# Patient Record
Sex: Female | Born: 1955 | Race: White | Hispanic: No | Marital: Married | Smoking: Former smoker
Health system: Southern US, Community
[De-identification: ages and names within clinical notes are randomized; demographics above are authoritative.]

## PROBLEM LIST (undated history)

## (undated) DIAGNOSIS — I10 Essential (primary) hypertension: Secondary | ICD-10-CM

## (undated) DIAGNOSIS — G4733 Obstructive sleep apnea (adult) (pediatric): Secondary | ICD-10-CM

## (undated) DIAGNOSIS — F32A Depression, unspecified: Secondary | ICD-10-CM

## (undated) DIAGNOSIS — Z9989 Dependence on other enabling machines and devices: Secondary | ICD-10-CM

## (undated) DIAGNOSIS — R112 Nausea with vomiting, unspecified: Secondary | ICD-10-CM

## (undated) DIAGNOSIS — R569 Unspecified convulsions: Secondary | ICD-10-CM

## (undated) DIAGNOSIS — Z9889 Other specified postprocedural states: Secondary | ICD-10-CM

## (undated) DIAGNOSIS — M1990 Unspecified osteoarthritis, unspecified site: Secondary | ICD-10-CM

## (undated) DIAGNOSIS — R011 Cardiac murmur, unspecified: Secondary | ICD-10-CM

## (undated) DIAGNOSIS — F419 Anxiety disorder, unspecified: Secondary | ICD-10-CM

## (undated) DIAGNOSIS — I82609 Acute embolism and thrombosis of unspecified veins of unspecified upper extremity: Secondary | ICD-10-CM

## (undated) DIAGNOSIS — G4730 Sleep apnea, unspecified: Secondary | ICD-10-CM

## (undated) DIAGNOSIS — K759 Inflammatory liver disease, unspecified: Secondary | ICD-10-CM

## (undated) DIAGNOSIS — J449 Chronic obstructive pulmonary disease, unspecified: Secondary | ICD-10-CM

## (undated) DIAGNOSIS — J189 Pneumonia, unspecified organism: Secondary | ICD-10-CM

## (undated) DIAGNOSIS — F329 Major depressive disorder, single episode, unspecified: Secondary | ICD-10-CM

## (undated) HISTORY — PX: APPENDECTOMY: SHX54

## (undated) HISTORY — PX: OTHER SURGICAL HISTORY: SHX169

## (undated) HISTORY — PX: TUBAL LIGATION: SHX77

## (undated) HISTORY — PX: ABDOMINAL HYSTERECTOMY: SHX81

## (undated) HISTORY — PX: FRACTURE SURGERY: SHX138

## (undated) HISTORY — PX: BACK SURGERY: SHX140

## (undated) HISTORY — PX: ROTATOR CUFF REPAIR: SHX139

## (undated) HISTORY — PX: CHOLECYSTECTOMY: SHX55

## (undated) HISTORY — PX: BLADDER SUSPENSION: SHX72

## (undated) HISTORY — PX: EYE SURGERY: SHX253

---

## 1999-07-24 ENCOUNTER — Other Ambulatory Visit: Admission: RE | Admit: 1999-07-24 | Discharge: 1999-08-09 | Payer: Self-pay

## 2002-04-05 ENCOUNTER — Inpatient Hospital Stay (HOSPITAL_COMMUNITY): Admission: RE | Admit: 2002-04-05 | Discharge: 2002-04-07 | Payer: Self-pay | Admitting: Orthopedic Surgery

## 2002-04-05 ENCOUNTER — Encounter: Payer: Self-pay | Admitting: Orthopedic Surgery

## 2003-01-22 ENCOUNTER — Encounter: Payer: Self-pay | Admitting: Emergency Medicine

## 2003-01-22 ENCOUNTER — Emergency Department (HOSPITAL_COMMUNITY): Admission: EM | Admit: 2003-01-22 | Discharge: 2003-01-22 | Payer: Self-pay | Admitting: Emergency Medicine

## 2005-03-28 ENCOUNTER — Encounter: Admission: RE | Admit: 2005-03-28 | Discharge: 2005-03-28 | Payer: Self-pay | Admitting: Orthopaedic Surgery

## 2005-06-04 ENCOUNTER — Encounter: Admission: RE | Admit: 2005-06-04 | Discharge: 2005-06-04 | Payer: Self-pay | Admitting: Orthopedic Surgery

## 2005-06-18 ENCOUNTER — Encounter: Admission: RE | Admit: 2005-06-18 | Discharge: 2005-06-18 | Payer: Self-pay | Admitting: Orthopedic Surgery

## 2007-06-03 ENCOUNTER — Ambulatory Visit (HOSPITAL_BASED_OUTPATIENT_CLINIC_OR_DEPARTMENT_OTHER): Admission: RE | Admit: 2007-06-03 | Discharge: 2007-06-03 | Payer: Self-pay | Admitting: Orthopedic Surgery

## 2010-12-15 ENCOUNTER — Encounter: Payer: Self-pay | Admitting: Orthopaedic Surgery

## 2011-04-08 NOTE — Op Note (Signed)
Tiffany Hopkins, Tiffany Hopkins               ACCOUNT NO.:  [id number]   MEDICAL RECORD NO.:  [id number]          PATIENT TYPE:  AMB   LOCATION:  [facility]                         FACILITY:  [facility]   PHYSICIAN:  Deidre Ala, M.D.    DATE OF BIRTH:  11/07/1956   DATE OF PROCEDURE:  06/03/2007  DATE OF DISCHARGE:                               OPERATIVE REPORT   PREOPERATIVE DIAGNOSIS:  Impingement syndrome, right shoulder, with  acromioclavicular joint arthritis, rule out rotator cuff tear.   POSTOPERATIVE DIAGNOSES:  1. Impingement syndrome, right shoulder, with acromioclavicular joint      arthritis, rule out rotator cuff tear.  2. Partial-thickness rotator cuff tear, critical zone.   PROCEDURE:  1. Right shoulder operative arthroscopy with subacromial arch      decompression acromioplasty.  2. Arthroscopic distal clavicle resection.  3. Subdeltoid bursectomy.  4. Debridement 50% thickness rotator cuff tear, small 2-cm lesion.      Central tendon of rotator cuff intact to tuberosity.   SURGEON:  1. Charlesetta Shanks, M.D.   ASSISTANT:  Phineas Semen, P.A.-C.   ANESTHESIA:  General endotracheal with scalene block.   CULTURES:  None.   DRAINS:  None.   ESTIMATED BLOOD LOSS:  Minimal.   PATHOLOGIC FINDINGS AND HISTORY:  Rosalin has had bilateral shoulder  impingement syndrome and AC joint arthritis, type 3 acromion, AC joint  squaring changes.  We saw her December 07, 2006.  At that time her  shoulders were hurting again.  This has happened before.  We injected  both shoulders and both AC joints.  She came back in on May 24, 2007.  She was getting increased pain.  She had said on a visit 2 months prior  that she was ready to have her surgery done, that the pain was getting  to an increasing level.  Right was greater than left.  When she came  back on May 24, 2007 she desired to proceed with surgical intervention.  She wanted to forego an MRI scan.  She knew we might find a rotator cuff  tear  that needed to be repaired.  After thorough explanation of the  procedure, she elected to proceed.   At surgery, she had a type 3 acromion with an anterior hook.  She had an  obviously arthritic distal clavicle.  She had fraying of the superior  labrum without slap detachment.  The biceps long head tendon looked good  and was anchored well.  There was undersurface synovitis in the anterior  triangle and underneath the rotator cuff and supraspinatus.  The  glenohumeral joint looked good.  She had intense subdeltoid bursitis,  and she had a partial-thickness delaminated rotator cuff tear in the  central zone, but it was stable and everything else was intact to the  tuberosity.  This measured about the size of a dime.  I debrided the  edges so they would not be prominent, but I felt it to be basically  intact and decompressed and she had good intact rotator cuff,  supraspinatus, infraspinatus, and subscapularis to tuberosity otherwise.  This was in the  critical zone.   PROCEDURE:  With adequate anesthesia obtained using endotracheal  technique, 1 gram Ancef given IV prophylaxis, the patient was placed in  a supine beach chair position.  The right shoulder was prepped and  draped in the standard fashion.  Skin markings were made for anatomic  positioning.  I then injected the subacromial space with 20 mL 0.5%  Marcaine with epinephrine to open up the space.   I then entered the shoulder through a posterior portal.  An anterior  portal was established just lateral to the coracoid.  I then shaved  after probing the superior labrum and around the anterior triangle with  synovitis and smoothed with the ablator on 1.  The undersurface of the  rotator cuff was also so shaved and smoothed.  The portals were reversed  and similar shavings carried out.  I then entered the subacromial space  from the posterior portal.  An anterolateral portal was established, and  marked bursitis was excised with  the reciprocating shaver.  I then  shaved the anterior undersurface of the acromion and used the ablator to  cauterize.  A 6.0 bur was brought in after release with the Bovie of the  CA ligament.  Anterior acromioplasty was carried out to [facility] margins  to the roof of the subacromial space with the 6.0 bur.  I then turned  the scope medially sideways where through the anterior portal I debrided  the AC meniscus and completed distal clavicle resection two shaver  breadths in.  This was somewhat long and arduous due to her tendency to  bleed, but we did finally resect well to the coracoclavicular and conoid  ligaments.  We then entered the shoulder through a lateral portal,  completed a minor distal clavicle trimming, and completed acromioplasty  back to the bicortical bone from looking from the side through the  anterior portal with a bur in the manner of Caspari.  I then shaved the  bursa with internal, external rotation in abduction and probed the tear.  I used a basket to trim the edges and then smoothed the defect with the  ablator on 1.  I found it not to be through-and-through, about 50% still  intact, and we were still intact around to bone, so I did not elect to  do more than a debridement.  Good clearance had been obtained.  The  shoulder was irrigated through the scope.  No Marcaine was placed.  The  portals were left open.  A bulky sterile compressive dressing was  applied with sling.   The patient having tolerated procedure well, was awakened and taken to  the recovery room in satisfactory condition to be discharged per  outpatient routine, given Percocet for pain and told call the office for  recheck tomorrow.           ______________________________  V. Charlesetta Shanks, M.D.     VEP/MEDQ  D:  06/03/2007  T:  06/04/2007  Job:  [id number]

## 2011-04-11 NOTE — Op Note (Signed)
[place]  Patient:    Tiffany Hopkins, Tiffany Hopkins Visit Number: [id number] MRN: [id number]          Service Type: SUR Location: 5000 [id number] [id number] Attending Physician:  Drema Pry Dictated by:   Jearld Adjutant, M.D. Proc. Date: 04/05/02 Admit Date:  04/05/2002   CC:         Alonna Minium, M.D.  Reatha Armour, M.D.  Dr. Tommie Ard   Operative Report  PREOPERATIVE DIAGNOSIS:  Stress fracture medial right femoral neck.  POSTOPERATIVE DIAGNOSIS:  Stress fracture medial right femoral neck.  PROCEDURE:  Right femoral neck stress fracture in situ pinning with three 6.5 cannulated cancellous screws Ace types.  SURGEON:  Jearld Adjutant, M.D.  ASSISTANT:  Malena Peer, P.A.C.  ANESTHESIA:  General endotracheal anesthesia.  CULTURES:  None.  DRAINS:  None.  ESTIMATED BLOOD LOSS:  Minimal.  INDICATIONS:  Nayali is a patient of Dr. Reatha Armour and Dr. Tommie Ard. She lives in [place], they may be in the [place] area.  In any case, she has had some hip pain in the trochanteric bursa area that was exquisitely painful causing her to limp.  She had a cortisone injection there by Dr. Littie Deeds.  Then I saw her on Mar 25, 2002, and injected.  Her x-rays were negative.  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  She had some knee pain also.  I got right on the trochanteric bursa and that pain has cleared, but what she now has is intense pain in the medial groin area, cannot put weight on it, better with rest, better sitting, better at night, but miserable when she puts weight on it. Dr. Crecencio Mc in my office saw her and got an MRI scan which was consistent with a stress fracture.  Again, the history was extremely better with rest, only painful with stance.  Repeat x-rays still showed some slight increase in bone density in the inferior neck, but no frank fracture.  In any case, the option was for the patient to be touchdown weightbearing on a walker for 6  to 8 weeks, versus pinning in situ and she chose pinning.  PATHOLOGY:  At surgery today, we placed three slightly divergent pins in a shape of a triangle in the middle and inferior neck with 6.5 cannulated cancellous Ace screws.  DESCRIPTION OF PROCEDURE:  With adequate anesthesia obtained using endotracheal technique, 1 gram of Ancef given IV prophylactically, the patient was placed supine on a fracture table with the left leg up in a stirrup and the right leg in traction in internal rotation.  After standard prepping and draping, we then placed the guide pins in the appropriate orientation to achieve a triangle with the base superior and the apex inferior, the most inferior pin along the neck of the femur.  C-arm fluoroscopy confirmed position on AP and lateral view with slight divergence with the appropriate length.  We then placed two 85 mm screws and one 95 mm screw and removed the guide pin.  The wounds were then closed.  This was done percutaneously.  They were closed with staples.  Some Marcaine was placed in the area and pressure dressings applied.  The patient having tolerated the procedure well was awakened and taken to the recovery room in satisfactory condition to be admitted for some physical therapy and then discharged. Dictated by:   Jearld Adjutant, M.D. Attending Physician:  Drema Pry DD:  04/05/02 TD:  04/06/02 Job: [id number]

## 2011-04-11 NOTE — Discharge Summary (Signed)
[place]. [facility]  Patient:    Tiffany Hopkins, Tiffany Hopkins Visit Number: [id number] MRN: [id number]          Service Type: SUR Location: 5000 5007 01 Attending Physician:  Drema Pry Dictated by:   Anise Salvo. Chestine Spore, P.A. Admit Date:  04/05/2002 Discharge Date: 04/07/2002                             Discharge Summary  ADMISSION DIAGNOSIS: Stress fracture of right femoral neck.  DISCHARGE DIAGNOSIS:  Stress fracture of right femoral neck status post percutaneous pinning.  SUMMARY:  The patient was admitted to [facility], taken to the operating room, was placed asleep and intubated.  Three percutaneous 6.5 Ace hip pins were placed under fluoroscopic guidance into the right femoral neck across the stress fracture.  The three small wounds were closed with staples and the patient was taken to the recovery room in stable condition.  On Apr 06, 2002, postoperative day one, the patient was doing well.  She had been able to get up, had been ambulating a little bit. Her pain was reasonably well controlled and she was afebrile. The wounds looked benign.  On Apr 07, 2002, postoperative day two, she was having minimal pain well controlled and stated that she was ready to go home.  She was afebrile, the wounds were benign, the dressings were changed and she was ambulating well with a walker.  She was steady and stable on her feet.  The plan was to send her home.  CONSULTS OBTAINED:  Physical therapy and Discharge Planning.  DISCHARGE INSTRUCTIONS: 1. Activity:  Weight bearing as tolerated ambulating with a rolling walker   until she felt she was stable and no longer needed it at which point she    could go down to a cane. 2. Diet:  2000 calorie ADA diet as tolerated.  WOUND CARE:  Keep the dressing clean and dry and intact until Sunday, May 18, at which point she can shower and then discontinue the dressing.  SPECIAL INSTRUCTIONS:  She was instructed to  call for increasing pain, increasing redness, increasing swelling, increasing drainage, a temperature greater than 101.5 or chills.  DISCHARGE MEDICATIONS: 1. Glucotrol XL 2.5  mg one p.o. q.d. 2. Hydrochlorothiazide 25 mg one p.o. q.d. 3. Aciphex 20 mg one p.o. q.d. 4. Mepergan Fortis one p.o. q.4h p.r.n. pain; she was given a prescription for    #40 with no refills. 5. Robaxin 750 mg one p.o. q.6h p.r.n. cramping or spasm; she was given a    prescription for #25 with two refills. 6. Acetaminophen 325 mg two p.o. q.6h p.r.n. temperature greater than 100.5.  FOLLOW-UP: Follow-up appointment with Dr. Renae Fickle in the office on Apr 15, 2002. She was instructed to call for an appointment.  CONDITION UPON DISCHARGE:  Stable. Dictated by:   Anise Salvo. Chestine Spore, P.A. Attending Physician:  Drema Pry DD:  04/07/02 TD:  04/10/02 Job: [id number]

## 2011-05-29 ENCOUNTER — Other Ambulatory Visit: Payer: Self-pay | Admitting: Orthopedic Surgery

## 2011-05-29 DIAGNOSIS — M545 Low back pain, unspecified: Secondary | ICD-10-CM

## 2011-06-07 ENCOUNTER — Ambulatory Visit
Admission: RE | Admit: 2011-06-07 | Discharge: 2011-06-07 | Disposition: A | Discharge status: Home / Self Care | Payer: 59 | Source: Ambulatory Visit | Attending: Orthopedic Surgery | Admitting: Orthopedic Surgery

## 2011-06-07 DIAGNOSIS — M545 Low back pain, unspecified: Secondary | ICD-10-CM

## 2011-09-09 LAB — I-STAT 8, (EC8 V) (CONVERTED LAB)
BUN: 8
Bicarbonate: 26.7 — ABNORMAL HIGH
Chloride: 103
Glucose, Bld: 208 — ABNORMAL HIGH
HCT: 48 — ABNORMAL HIGH
Operator id: 268271
Potassium: 3.9
pCO2, Ven: 41.7 — ABNORMAL LOW
pH, Ven: 7.414 — ABNORMAL HIGH

## 2012-04-27 ENCOUNTER — Other Ambulatory Visit: Payer: Self-pay | Admitting: Physical Medicine and Rehabilitation

## 2012-04-27 DIAGNOSIS — M4807 Spinal stenosis, lumbosacral region: Secondary | ICD-10-CM

## 2012-04-27 DIAGNOSIS — M545 Low back pain, unspecified: Secondary | ICD-10-CM

## 2012-05-02 ENCOUNTER — Ambulatory Visit
Admission: RE | Admit: 2012-05-02 | Discharge: 2012-05-02 | Disposition: A | Discharge status: Home / Self Care | Payer: 59 | Source: Ambulatory Visit | Attending: Physical Medicine and Rehabilitation | Admitting: Physical Medicine and Rehabilitation

## 2012-05-02 DIAGNOSIS — M545 Low back pain, unspecified: Secondary | ICD-10-CM

## 2012-05-02 DIAGNOSIS — M4807 Spinal stenosis, lumbosacral region: Secondary | ICD-10-CM

## 2012-05-10 ENCOUNTER — Other Ambulatory Visit: Payer: Self-pay | Admitting: Orthopedic Surgery

## 2012-05-12 ENCOUNTER — Encounter (HOSPITAL_COMMUNITY): Payer: Self-pay | Admitting: Pharmacy Technician

## 2012-05-14 ENCOUNTER — Inpatient Hospital Stay (HOSPITAL_COMMUNITY): Admission: RE | Admit: 2012-05-14 | Payer: 59 | Source: Ambulatory Visit

## 2012-05-18 ENCOUNTER — Encounter (HOSPITAL_COMMUNITY)
Admission: RE | Admit: 2012-05-18 | Discharge: 2012-05-18 | Disposition: A | Discharge status: Home / Self Care | Payer: 59 | Source: Ambulatory Visit | Attending: Orthopedic Surgery | Admitting: Orthopedic Surgery

## 2012-05-18 ENCOUNTER — Encounter (HOSPITAL_COMMUNITY): Payer: Self-pay

## 2012-05-18 HISTORY — DX: Pneumonia, unspecified organism: J18.9

## 2012-05-18 HISTORY — DX: Depression, unspecified: F32.A

## 2012-05-18 HISTORY — DX: Chronic obstructive pulmonary disease, unspecified: J44.9

## 2012-05-18 HISTORY — DX: Unspecified convulsions: R56.9

## 2012-05-18 HISTORY — DX: Anxiety disorder, unspecified: F41.9

## 2012-05-18 HISTORY — DX: Unspecified osteoarthritis, unspecified site: M19.90

## 2012-05-18 HISTORY — DX: Cardiac murmur, unspecified: R01.1

## 2012-05-18 HISTORY — DX: Major depressive disorder, single episode, unspecified: F32.9

## 2012-05-18 HISTORY — DX: Inflammatory liver disease, unspecified: K75.9

## 2012-05-18 LAB — COMPREHENSIVE METABOLIC PANEL
AST: 22 U/L (ref 0–37)
Albumin: 4 g/dL (ref 3.5–5.2)
BUN: 10 mg/dL (ref 6–23)
Calcium: 9.8 mg/dL (ref 8.4–10.5)
Creatinine, Ser: 0.65 mg/dL (ref 0.50–1.10)

## 2012-05-18 LAB — URINALYSIS, ROUTINE W REFLEX MICROSCOPIC
Glucose, UA: 100 mg/dL — AB
Hgb urine dipstick: NEGATIVE
Ketones, ur: NEGATIVE mg/dL
Protein, ur: NEGATIVE mg/dL
Urobilinogen, UA: 0.2 mg/dL (ref 0.0–1.0)

## 2012-05-18 LAB — TYPE AND SCREEN
ABO/RH(D): B POS
Antibody Screen: NEGATIVE

## 2012-05-18 LAB — DIFFERENTIAL
Basophils Absolute: 0 10*3/uL (ref 0.0–0.1)
Lymphocytes Relative: 31 % (ref 12–46)
Monocytes Absolute: 0.6 10*3/uL (ref 0.1–1.0)
Monocytes Relative: 7 % (ref 3–12)
Neutro Abs: 4.9 10*3/uL (ref 1.7–7.7)
Neutrophils Relative %: 60 % (ref 43–77)

## 2012-05-18 LAB — CBC
MCH: 30.7 pg (ref 26.0–34.0)
MCHC: 34 g/dL (ref 30.0–36.0)
MCV: 90.5 fL (ref 78.0–100.0)
Platelets: 199 10*3/uL (ref 150–400)
RDW: 13 % (ref 11.5–15.5)
WBC: 8.1 10*3/uL (ref 4.0–10.5)

## 2012-05-18 LAB — PROTIME-INR: INR: 0.99 (ref 0.00–1.49)

## 2012-05-18 LAB — SURGICAL PCR SCREEN: MRSA, PCR: NEGATIVE

## 2012-05-18 NOTE — Pre-Procedure Instructions (Signed)
20 Tiffany Hopkins  05/18/2012   Your procedure is scheduled on:  05-20-2012  Report to [facility] at 10:30 AM.  Call this number if you have problems the morning of surgery: [phone]   Remember:   Do not eat food or drink:After Midnight. .  .  Take these medicines the morning of surgery with A SIP OF WATER: cetirizine(Zyrtec),   Do not wear jewelry, make-up or nail polish.  Do not wear lotions, powders, or perfumes. You may wear deodorant.  Do not shave 48 hours prior to surgery. Men may shave face and neck.  Do not bring valuables to the hospital.  Contacts, dentures or bridgework may not be worn into surgery.  Leave suitcase in the car. After surgery it may be brought to your room.  For patients admitted to the hospital, checkout time is 11:00 AM the day of discharge.  Special Instructions: Incentive Spirometry - Practice and bring it with you on the day of surgery. and CHG Shower Use Special Wash: 1/2 bottle night before surgery and 1/2 bottle morning of surgery.     Please read over the following fact sheets that you were given: Pain Booklet, Coughing and Deep Breathing, Blood Transfusion Information, Surgical Site Infection Prevention and Anesthesia Post-op Instructions

## 2012-05-19 MED ORDER — POVIDONE-IODINE 7.5 % EX SOLN
Freq: Once | CUTANEOUS | Status: DC
  Filled 2012-05-19: qty 118

## 2012-05-19 MED ORDER — CEFAZOLIN SODIUM-DEXTROSE 2-3 GM-% IV SOLR
2.0000 g | INTRAVENOUS | Status: AC
  Administered 2012-05-20: 2 g via INTRAVENOUS
  Filled 2012-05-19: qty 50

## 2012-05-20 ENCOUNTER — Ambulatory Visit (HOSPITAL_COMMUNITY): Payer: 59 | Admitting: Anesthesiology

## 2012-05-20 ENCOUNTER — Ambulatory Visit (HOSPITAL_COMMUNITY): Payer: 59

## 2012-05-20 ENCOUNTER — Encounter (HOSPITAL_COMMUNITY): Payer: Self-pay | Admitting: Anesthesiology

## 2012-05-20 ENCOUNTER — Encounter (HOSPITAL_COMMUNITY)
Admission: RE | Disposition: A | Discharge status: Home / Self Care | Payer: Self-pay | Source: Ambulatory Visit | Attending: Orthopedic Surgery

## 2012-05-20 ENCOUNTER — Encounter (HOSPITAL_COMMUNITY): Payer: Self-pay | Admitting: Certified Registered Nurse Anesthetist

## 2012-05-20 ENCOUNTER — Encounter (HOSPITAL_COMMUNITY): Payer: Self-pay | Admitting: *Deleted

## 2012-05-20 ENCOUNTER — Inpatient Hospital Stay (HOSPITAL_COMMUNITY)
Admission: RE | Admit: 2012-05-20 | Discharge: 2012-05-22 | DRG: 460 | Disposition: A | Discharge status: Home / Self Care | Payer: 59 | Source: Ambulatory Visit | Attending: Orthopedic Surgery | Admitting: Orthopedic Surgery

## 2012-05-20 DIAGNOSIS — Z880 Allergy status to penicillin: Secondary | ICD-10-CM

## 2012-05-20 DIAGNOSIS — M4310 Spondylolisthesis, site unspecified: Secondary | ICD-10-CM | POA: Diagnosis present

## 2012-05-20 DIAGNOSIS — J449 Chronic obstructive pulmonary disease, unspecified: Secondary | ICD-10-CM | POA: Diagnosis present

## 2012-05-20 DIAGNOSIS — Z8701 Personal history of pneumonia (recurrent): Secondary | ICD-10-CM

## 2012-05-20 DIAGNOSIS — F329 Major depressive disorder, single episode, unspecified: Secondary | ICD-10-CM | POA: Diagnosis present

## 2012-05-20 DIAGNOSIS — F411 Generalized anxiety disorder: Secondary | ICD-10-CM | POA: Diagnosis present

## 2012-05-20 DIAGNOSIS — Z90710 Acquired absence of both cervix and uterus: Secondary | ICD-10-CM

## 2012-05-20 DIAGNOSIS — E119 Type 2 diabetes mellitus without complications: Secondary | ICD-10-CM | POA: Diagnosis present

## 2012-05-20 DIAGNOSIS — Z87891 Personal history of nicotine dependence: Secondary | ICD-10-CM

## 2012-05-20 DIAGNOSIS — M129 Arthropathy, unspecified: Secondary | ICD-10-CM | POA: Diagnosis present

## 2012-05-20 DIAGNOSIS — Z8619 Personal history of other infectious and parasitic diseases: Secondary | ICD-10-CM

## 2012-05-20 DIAGNOSIS — R011 Cardiac murmur, unspecified: Secondary | ICD-10-CM | POA: Diagnosis present

## 2012-05-20 DIAGNOSIS — Z01812 Encounter for preprocedural laboratory examination: Secondary | ICD-10-CM

## 2012-05-20 DIAGNOSIS — I10 Essential (primary) hypertension: Secondary | ICD-10-CM | POA: Diagnosis present

## 2012-05-20 DIAGNOSIS — J4489 Other specified chronic obstructive pulmonary disease: Secondary | ICD-10-CM | POA: Diagnosis present

## 2012-05-20 DIAGNOSIS — M545 Low back pain, unspecified: Secondary | ICD-10-CM

## 2012-05-20 DIAGNOSIS — M51379 Other intervertebral disc degeneration, lumbosacral region without mention of lumbar back pain or lower extremity pain: Principal | ICD-10-CM | POA: Diagnosis present

## 2012-05-20 DIAGNOSIS — M8540 Solitary bone cyst, unspecified site: Secondary | ICD-10-CM | POA: Diagnosis present

## 2012-05-20 DIAGNOSIS — Z9089 Acquired absence of other organs: Secondary | ICD-10-CM

## 2012-05-20 DIAGNOSIS — F3289 Other specified depressive episodes: Secondary | ICD-10-CM | POA: Diagnosis present

## 2012-05-20 DIAGNOSIS — G589 Mononeuropathy, unspecified: Secondary | ICD-10-CM | POA: Diagnosis present

## 2012-05-20 DIAGNOSIS — M5137 Other intervertebral disc degeneration, lumbosacral region: Principal | ICD-10-CM | POA: Diagnosis present

## 2012-05-20 LAB — GLUCOSE, CAPILLARY
Glucose-Capillary: 252 mg/dL — ABNORMAL HIGH (ref 70–99)
Glucose-Capillary: 262 mg/dL — ABNORMAL HIGH (ref 70–99)
Glucose-Capillary: 263 mg/dL — ABNORMAL HIGH (ref 70–99)

## 2012-05-20 SURGERY — TRANSFORAMINAL LUMBAR INTERBODY FUSION (TLIF) WITH PEDICLE SCREW FIXATION 1 LEVEL
Anesthesia: General | Site: Spine Lumbar | Laterality: Right | Wound class: Clean

## 2012-05-20 MED ORDER — FENTANYL CITRATE 0.05 MG/ML IJ SOLN
INTRAMUSCULAR | Status: DC | PRN
  Administered 2012-05-20: 200 ug via INTRAVENOUS
  Administered 2012-05-20 (×3): 50 ug via INTRAVENOUS

## 2012-05-20 MED ORDER — THROMBIN 20000 UNITS EX SOLR
CUTANEOUS | Status: DC | PRN
  Administered 2012-05-20 (×2): 20000 [IU] via TOPICAL

## 2012-05-20 MED ORDER — 0.9 % SODIUM CHLORIDE (POUR BTL) OPTIME
TOPICAL | Status: DC | PRN
  Administered 2012-05-20: 1000 mL

## 2012-05-20 MED ORDER — ONDANSETRON HCL 4 MG/2ML IJ SOLN
4.0000 mg | INTRAMUSCULAR | Status: DC | PRN
  Filled 2012-05-20: qty 2

## 2012-05-20 MED ORDER — ROCURONIUM BROMIDE 100 MG/10ML IV SOLN
INTRAVENOUS | Status: DC | PRN
  Administered 2012-05-20: 10 mg via INTRAVENOUS
  Administered 2012-05-20: 60 mg via INTRAVENOUS
  Administered 2012-05-20: 10 mg via INTRAVENOUS

## 2012-05-20 MED ORDER — ONDANSETRON HCL 4 MG/2ML IJ SOLN
4.0000 mg | Freq: Four times a day (QID) | INTRAMUSCULAR | Status: DC | PRN
Start: 2012-05-20 — End: 2012-05-21
  Administered 2012-05-20: 4 mg via INTRAVENOUS

## 2012-05-20 MED ORDER — THROMBIN 20000 UNITS EX SOLR
CUTANEOUS | Status: AC
  Filled 2012-05-20: qty 40000

## 2012-05-20 MED ORDER — DROPERIDOL 2.5 MG/ML IJ SOLN: 0.6250 mg | INTRAMUSCULAR | Status: DC | PRN

## 2012-05-20 MED ORDER — SODIUM CHLORIDE 0.9 % IJ SOLN: 3.0000 mL | INTRAMUSCULAR | Status: DC | PRN

## 2012-05-20 MED ORDER — MENTHOL 3 MG MT LOZG: 1.0000 | LOZENGE | OROMUCOSAL | Status: DC | PRN

## 2012-05-20 MED ORDER — PROPOFOL 10 MG/ML IV EMUL
INTRAVENOUS | Status: DC | PRN
  Administered 2012-05-20: 150 mg via INTRAVENOUS

## 2012-05-20 MED ORDER — THROMBIN 20000 UNITS EX SOLR
CUTANEOUS | Status: AC
  Filled 2012-05-20: qty 20000

## 2012-05-20 MED ORDER — OXYCODONE HCL 10 MG PO TB12
10.0000 mg | ORAL_TABLET | Freq: Two times a day (BID) | ORAL | Status: DC
  Administered 2012-05-21 – 2012-05-22 (×4): 10 mg via ORAL
  Filled 2012-05-20 (×4): qty 1

## 2012-05-20 MED ORDER — CEFAZOLIN SODIUM 1-5 GM-% IV SOLN
1.0000 g | Freq: Three times a day (TID) | INTRAVENOUS | Status: AC
  Administered 2012-05-20 – 2012-05-21 (×2): 1 g via INTRAVENOUS
  Filled 2012-05-20 (×2): qty 50

## 2012-05-20 MED ORDER — MORPHINE SULFATE (PF) 1 MG/ML IV SOLN
INTRAVENOUS | Status: AC
  Filled 2012-05-20: qty 25

## 2012-05-20 MED ORDER — MIDAZOLAM HCL 5 MG/5ML IJ SOLN
INTRAMUSCULAR | Status: DC | PRN
  Administered 2012-05-20: 3 mg via INTRAVENOUS

## 2012-05-20 MED ORDER — INSULIN DETEMIR 100 UNIT/ML ~~LOC~~ SOLN
90.0000 [IU] | Freq: Every day | SUBCUTANEOUS | Status: DC
  Administered 2012-05-21 (×2): 90 [IU] via SUBCUTANEOUS
  Filled 2012-05-20: qty 10

## 2012-05-20 MED ORDER — LACTATED RINGERS IV SOLN
INTRAVENOUS | Status: DC | PRN
  Administered 2012-05-20 (×2): via INTRAVENOUS

## 2012-05-20 MED ORDER — OXYCODONE-ACETAMINOPHEN 5-325 MG PO TABS
1.0000 | ORAL_TABLET | ORAL | Status: DC | PRN
  Administered 2012-05-21 – 2012-05-22 (×4): 2 via ORAL
  Filled 2012-05-20 (×4): qty 2

## 2012-05-20 MED ORDER — MONTELUKAST SODIUM 10 MG PO TABS
10.0000 mg | ORAL_TABLET | Freq: Every day | ORAL | Status: DC
  Administered 2012-05-21 (×2): 10 mg via ORAL
  Filled 2012-05-20 (×3): qty 1

## 2012-05-20 MED ORDER — VILAZODONE HCL 40 MG PO TABS
40.0000 mg | ORAL_TABLET | Freq: Every day | ORAL | Status: DC
  Administered 2012-05-21 (×2): 40 mg via ORAL
  Filled 2012-05-20 (×3): qty 1

## 2012-05-20 MED ORDER — DOCUSATE SODIUM 100 MG PO CAPS
100.0000 mg | ORAL_CAPSULE | Freq: Two times a day (BID) | ORAL | Status: DC
  Administered 2012-05-21 – 2012-05-22 (×4): 100 mg via ORAL
  Filled 2012-05-20 (×4): qty 1

## 2012-05-20 MED ORDER — PROPOFOL 10 MG/ML IV EMUL
INTRAVENOUS | Status: DC | PRN
  Administered 2012-05-20: 50 ug/kg/min via INTRAVENOUS

## 2012-05-20 MED ORDER — ATORVASTATIN CALCIUM 10 MG PO TABS
10.0000 mg | ORAL_TABLET | Freq: Every day | ORAL | Status: DC
  Administered 2012-05-21 (×2): 10 mg via ORAL
  Filled 2012-05-20 (×3): qty 1

## 2012-05-20 MED ORDER — DIAZEPAM 5 MG PO TABS
ORAL_TABLET | ORAL | Status: AC
  Administered 2012-05-20: 5 mg
  Filled 2012-05-20: qty 1

## 2012-05-20 MED ORDER — THROMBIN 20000 UNITS EX KIT
PACK | CUTANEOUS | Status: DC | PRN
  Administered 2012-05-20: 14:00:00 via TOPICAL

## 2012-05-20 MED ORDER — BUPIVACAINE-EPINEPHRINE PF 0.25-1:200000 % IJ SOLN
INTRAMUSCULAR | Status: AC
  Filled 2012-05-20: qty 30

## 2012-05-20 MED ORDER — LACTATED RINGERS IV SOLN
INTRAVENOUS | Status: DC | PRN
  Administered 2012-05-20: 15:00:00 via INTRAVENOUS

## 2012-05-20 MED ORDER — HEPARIN SODIUM (PORCINE) 1000 UNIT/ML IJ SOLN
INTRAMUSCULAR | Status: AC
  Filled 2012-05-20: qty 1

## 2012-05-20 MED ORDER — DIPHENHYDRAMINE HCL 12.5 MG/5ML PO ELIX: 12.5000 mg | ORAL_SOLUTION | Freq: Four times a day (QID) | ORAL | Status: DC | PRN

## 2012-05-20 MED ORDER — L-METHYLFOLATE-B6-B12 3-35-2 MG PO TABS
1.0000 | ORAL_TABLET | Freq: Every day | ORAL | Status: DC
  Administered 2012-05-21 – 2012-05-22 (×3): 1 via ORAL
  Filled 2012-05-20 (×3): qty 1

## 2012-05-20 MED ORDER — LISINOPRIL 5 MG PO TABS
5.0000 mg | ORAL_TABLET | Freq: Every day | ORAL | Status: DC
  Administered 2012-05-21 (×2): 5 mg via ORAL
  Filled 2012-05-20 (×2): qty 1

## 2012-05-20 MED ORDER — LACTATED RINGERS IV SOLN
INTRAVENOUS | Status: DC
  Administered 2012-05-20: 12:00:00 via INTRAVENOUS

## 2012-05-20 MED ORDER — LORATADINE 10 MG PO TABS
10.0000 mg | ORAL_TABLET | Freq: Every day | ORAL | Status: DC
  Administered 2012-05-21 – 2012-05-22 (×3): 10 mg via ORAL
  Filled 2012-05-20 (×3): qty 1

## 2012-05-20 MED ORDER — BUPIVACAINE-EPINEPHRINE 0.25% -1:200000 IJ SOLN
INTRAMUSCULAR | Status: DC | PRN
  Administered 2012-05-20: 21 mL

## 2012-05-20 MED ORDER — LIDOCAINE HCL 4 % MT SOLN
OROMUCOSAL | Status: DC | PRN
  Administered 2012-05-20: 4 mL via TOPICAL

## 2012-05-20 MED ORDER — ALUM & MAG HYDROXIDE-SIMETH 200-200-20 MG/5ML PO SUSP: 30.0000 mL | Freq: Four times a day (QID) | ORAL | Status: DC | PRN

## 2012-05-20 MED ORDER — LAMOTRIGINE 150 MG PO TABS
150.0000 mg | ORAL_TABLET | Freq: Every day | ORAL | Status: DC
  Administered 2012-05-21 (×2): 150 mg via ORAL
  Filled 2012-05-20 (×3): qty 1

## 2012-05-20 MED ORDER — SODIUM CHLORIDE 0.9 % IV SOLN
250.0000 mL | INTRAVENOUS | Status: DC
  Administered 2012-05-20: 250 mL via INTRAVENOUS

## 2012-05-20 MED ORDER — ZOLPIDEM TARTRATE 5 MG PO TABS
5.0000 mg | ORAL_TABLET | Freq: Every evening | ORAL | Status: DC | PRN
  Administered 2012-05-21: 5 mg via ORAL
  Filled 2012-05-20: qty 1

## 2012-05-20 MED ORDER — INSULIN GLULISINE 100 UNIT/ML ~~LOC~~ SOLN: 40.0000 [IU] | Freq: Three times a day (TID) | SUBCUTANEOUS | Status: DC

## 2012-05-20 MED ORDER — NALOXONE HCL 0.4 MG/ML IJ SOLN: 0.4000 mg | INTRAMUSCULAR | Status: DC | PRN

## 2012-05-20 MED ORDER — DIPHENHYDRAMINE HCL 50 MG/ML IJ SOLN: 12.5000 mg | Freq: Four times a day (QID) | INTRAMUSCULAR | Status: DC | PRN

## 2012-05-20 MED ORDER — PHENOL 1.4 % MT LIQD: 1.0000 | OROMUCOSAL | Status: DC | PRN

## 2012-05-20 MED ORDER — SENNA 8.6 MG PO TABS
1.0000 | ORAL_TABLET | Freq: Two times a day (BID) | ORAL | Status: DC
  Administered 2012-05-20 – 2012-05-22 (×4): 8.6 mg via ORAL
  Filled 2012-05-20 (×5): qty 1

## 2012-05-20 MED ORDER — FENTANYL CITRATE 0.05 MG/ML IJ SOLN
INTRAMUSCULAR | Status: AC
  Filled 2012-05-20: qty 2

## 2012-05-20 MED ORDER — ONDANSETRON HCL 4 MG/2ML IJ SOLN
INTRAMUSCULAR | Status: DC | PRN
  Administered 2012-05-20: 4 mg via INTRAVENOUS

## 2012-05-20 MED ORDER — FENTANYL CITRATE 0.05 MG/ML IJ SOLN
INTRAMUSCULAR | Status: AC
Start: 2012-05-20 — End: 2012-05-20
  Administered 2012-05-20: 50 ug via INTRAVENOUS
  Filled 2012-05-20: qty 2

## 2012-05-20 MED ORDER — SODIUM CHLORIDE 0.9 % IJ SOLN
3.0000 mL | Freq: Two times a day (BID) | INTRAMUSCULAR | Status: DC
  Administered 2012-05-20: 3 mL via INTRAVENOUS

## 2012-05-20 MED ORDER — DIAZEPAM 5 MG PO TABS
5.0000 mg | ORAL_TABLET | Freq: Four times a day (QID) | ORAL | Status: DC | PRN
  Administered 2012-05-21 – 2012-05-22 (×4): 5 mg via ORAL
  Filled 2012-05-20 (×4): qty 1

## 2012-05-20 MED ORDER — MORPHINE SULFATE (PF) 1 MG/ML IV SOLN
INTRAVENOUS | Status: DC
  Administered 2012-05-20: 2 mg via INTRAVENOUS
  Administered 2012-05-20: 9 mg via INTRAVENOUS
  Administered 2012-05-20 (×2): via INTRAVENOUS
  Administered 2012-05-20: 2 mg via INTRAVENOUS
  Administered 2012-05-21: 14 mg via INTRAVENOUS
  Administered 2012-05-21: 2 mg via INTRAVENOUS
  Filled 2012-05-20: qty 25

## 2012-05-20 MED ORDER — INSULIN ASPART 100 UNIT/ML ~~LOC~~ SOLN
40.0000 [IU] | Freq: Three times a day (TID) | SUBCUTANEOUS | Status: DC
  Administered 2012-05-21 – 2012-05-22 (×3): 40 [IU] via SUBCUTANEOUS

## 2012-05-20 MED ORDER — POTASSIUM CHLORIDE IN NACL 20-0.9 MEQ/L-% IV SOLN
INTRAVENOUS | Status: DC
  Administered 2012-05-20: 21:00:00 via INTRAVENOUS
  Filled 2012-05-20 (×6): qty 1000

## 2012-05-20 MED ORDER — ACETAMINOPHEN 325 MG PO TABS: 650.0000 mg | ORAL_TABLET | ORAL | Status: DC | PRN

## 2012-05-20 MED ORDER — FENTANYL CITRATE 0.05 MG/ML IJ SOLN
25.0000 ug | INTRAMUSCULAR | Status: DC | PRN
  Administered 2012-05-20 (×3): 50 ug via INTRAVENOUS

## 2012-05-20 MED ORDER — MORPHINE SULFATE 2 MG/ML IJ SOLN
2.0000 mg | INTRAMUSCULAR | Status: DC | PRN
  Administered 2012-05-21: 2 mg via INTRAVENOUS
  Filled 2012-05-20: qty 1

## 2012-05-20 MED ORDER — ACETAMINOPHEN 650 MG RE SUPP: 650.0000 mg | RECTAL | Status: DC | PRN

## 2012-05-20 MED ORDER — EPHEDRINE SULFATE 50 MG/ML IJ SOLN
INTRAMUSCULAR | Status: DC | PRN
  Administered 2012-05-20 (×2): 5 mg via INTRAVENOUS
  Administered 2012-05-20 (×2): 10 mg via INTRAVENOUS
  Administered 2012-05-20: 5 mg via INTRAVENOUS
  Administered 2012-05-20: 10 mg via INTRAVENOUS

## 2012-05-20 MED ORDER — PNEUMOCOCCAL VAC POLYVALENT 25 MCG/0.5ML IJ INJ
0.5000 mL | INJECTION | INTRAMUSCULAR | Status: AC
  Administered 2012-05-21: 0.5 mL via INTRAMUSCULAR
  Filled 2012-05-20: qty 0.5

## 2012-05-20 MED ORDER — HETASTARCH-ELECTROLYTES 6 % IV SOLN
INTRAVENOUS | Status: DC | PRN
  Administered 2012-05-20 (×2): via INTRAVENOUS

## 2012-05-20 MED ORDER — SODIUM CHLORIDE 0.9 % IJ SOLN: 9.0000 mL | INTRAMUSCULAR | Status: DC | PRN

## 2012-05-20 SURGICAL SUPPLY — 83 items
BENZOIN TINCTURE PRP APPL 2/3 (GAUZE/BANDAGES/DRESSINGS) ×3 IMPLANT
BLADE SURG ROTATE 9660 (MISCELLANEOUS) IMPLANT
BONE CHIP PRESERV 20CC (Bone Implant) ×3 IMPLANT
BUR ROUND PRECISION 4.0 (BURR) ×3 IMPLANT
CAGE CONCORDE BULLET 11X12X27 (Cage) ×1 IMPLANT
CAGE SPNL PRLL BLT NOSE 27X11 (Cage) ×2 IMPLANT
CARTRIDGE OIL MAESTRO DRILL (MISCELLANEOUS) ×2 IMPLANT
CLOTH BEACON ORANGE TIMEOUT ST (SAFETY) ×3 IMPLANT
CLSR STERI-STRIP ANTIMIC 1/2X4 (GAUZE/BANDAGES/DRESSINGS) ×3 IMPLANT
CONT SPEC STER OR (MISCELLANEOUS) ×3 IMPLANT
CORDS BIPOLAR (ELECTRODE) ×3 IMPLANT
COVER SURGICAL LIGHT HANDLE (MISCELLANEOUS) ×3 IMPLANT
DIFFUSER DRILL AIR PNEUMATIC (MISCELLANEOUS) ×3 IMPLANT
DRAIN CHANNEL 15F RND FF W/TCR (WOUND CARE) IMPLANT
DRAIN CHANNEL 19F RND (DRAIN) ×3 IMPLANT
DRAPE C-ARM 42X72 X-RAY (DRAPES) ×3 IMPLANT
DRAPE ORTHO SPLIT 77X108 STRL (DRAPES) ×1
DRAPE POUCH INSTRU U-SHP 10X18 (DRAPES) ×3 IMPLANT
DRAPE SURG 17X23 STRL (DRAPES) ×9 IMPLANT
DRAPE SURG ORHT 6 SPLT 77X108 (DRAPES) ×2 IMPLANT
DURAPREP 26ML APPLICATOR (WOUND CARE) ×3 IMPLANT
ELECT BLADE 4.0 EZ CLEAN MEGAD (MISCELLANEOUS)
ELECT CAUTERY BLADE 6.4 (BLADE) ×3 IMPLANT
ELECT REM PT RETURN 9FT ADLT (ELECTROSURGICAL) ×3
ELECTRODE BLDE 4.0 EZ CLN MEGD (MISCELLANEOUS) IMPLANT
ELECTRODE REM PT RTRN 9FT ADLT (ELECTROSURGICAL) ×2 IMPLANT
EVACUATOR SILICONE 100CC (DRAIN) ×3 IMPLANT
GAUZE SPONGE 4X4 16PLY XRAY LF (GAUZE/BANDAGES/DRESSINGS) ×6 IMPLANT
GLOVE BIO SURGEON STRL SZ8 (GLOVE) ×3 IMPLANT
GLOVE BIOGEL PI IND STRL 7.0 (GLOVE) ×2 IMPLANT
GLOVE BIOGEL PI IND STRL 8 (GLOVE) ×2 IMPLANT
GLOVE BIOGEL PI INDICATOR 7.0 (GLOVE) ×1
GLOVE BIOGEL PI INDICATOR 8 (GLOVE) ×1
GLOVE BIOGEL PI ORTHO PRO SZ7 (GLOVE) ×1
GLOVE PI ORTHO PRO STRL SZ7 (GLOVE) ×2 IMPLANT
GLOVE SURG SS PI 7.0 STRL IVOR (GLOVE) ×15 IMPLANT
GOWN PREVENTION PLUS XLARGE (GOWN DISPOSABLE) IMPLANT
GOWN STRL NON-REIN LRG LVL3 (GOWN DISPOSABLE) IMPLANT
GOWN STRL REIN XL XLG (GOWN DISPOSABLE) ×9 IMPLANT
IV CATH 14GX2 1/4 (CATHETERS) ×3 IMPLANT
KIT BASIN OR (CUSTOM PROCEDURE TRAY) ×3 IMPLANT
KIT POSITION SURG JACKSON T1 (MISCELLANEOUS) ×3 IMPLANT
KIT ROOM TURNOVER OR (KITS) ×3 IMPLANT
MARKER SKIN DUAL TIP RULER LAB (MISCELLANEOUS) ×3 IMPLANT
MIX DBX 20CC MTF (Putty) ×3 IMPLANT
NEEDLE BONE MARROW 8GX6 FENEST (NEEDLE) IMPLANT
NEEDLE HYPO 25GX1X1/2 BEV (NEEDLE) ×3 IMPLANT
NEEDLE SPNL 18GX3.5 QUINCKE PK (NEEDLE) ×6 IMPLANT
NS IRRIG 1000ML POUR BTL (IV SOLUTION) ×3 IMPLANT
OIL CARTRIDGE MAESTRO DRILL (MISCELLANEOUS) ×3
PACK LAMINECTOMY ORTHO (CUSTOM PROCEDURE TRAY) ×3 IMPLANT
PACK UNIVERSAL I (CUSTOM PROCEDURE TRAY) ×3 IMPLANT
PAD ARMBOARD 7.5X6 YLW CONV (MISCELLANEOUS) ×6 IMPLANT
PATTIES SURGICAL .5 X.5 (GAUZE/BANDAGES/DRESSINGS) ×6 IMPLANT
PATTIES SURGICAL .5 X1 (DISPOSABLE) ×3 IMPLANT
PATTIES SURGICAL .5X1.5 (GAUZE/BANDAGES/DRESSINGS) IMPLANT
ROD EXEDIUM PREBENT 5.5 40MM (Rod) ×1 IMPLANT
ROD EXEDIUM PREBENT 5.5X40 (Rod) ×2 IMPLANT
SCREW POLYAXIAL 7X45MM (Screw) ×12 IMPLANT
SCREW SET SINGLE INNER (Screw) ×12 IMPLANT
SPONGE GAUZE 4X4 12PLY (GAUZE/BANDAGES/DRESSINGS) IMPLANT
SPONGE INTESTINAL PEANUT (DISPOSABLE) ×3 IMPLANT
SPONGE SURGIFOAM ABS GEL 100 (HEMOSTASIS) ×3 IMPLANT
STRIP CLOSURE SKIN 1/2X4 (GAUZE/BANDAGES/DRESSINGS) IMPLANT
SURGIFLO TRUKIT (HEMOSTASIS) IMPLANT
SUT MNCRL AB 3-0 PS2 18 (SUTURE) ×3 IMPLANT
SUT MNCRL AB 4-0 PS2 18 (SUTURE) IMPLANT
SUT VIC AB 0 CT1 18XCR BRD 8 (SUTURE) ×2 IMPLANT
SUT VIC AB 0 CT1 27 (SUTURE) ×1
SUT VIC AB 0 CT1 27XBRD ANBCTR (SUTURE) ×2 IMPLANT
SUT VIC AB 0 CT1 8-18 (SUTURE) ×1
SUT VIC AB 1 CT1 18XCR BRD 8 (SUTURE) ×2 IMPLANT
SUT VIC AB 1 CT1 8-18 (SUTURE) ×1
SUT VIC AB 2-0 CT2 18 VCP726D (SUTURE) ×3 IMPLANT
SYR 20CC LL (SYRINGE) ×3 IMPLANT
SYR BULB IRRIGATION 50ML (SYRINGE) ×3 IMPLANT
SYR CONTROL 10ML LL (SYRINGE) ×3 IMPLANT
TAPE CLOTH SURG 4X10 WHT LF (GAUZE/BANDAGES/DRESSINGS) ×3 IMPLANT
TOWEL OR 17X24 6PK STRL BLUE (TOWEL DISPOSABLE) ×3 IMPLANT
TOWEL OR 17X26 10 PK STRL BLUE (TOWEL DISPOSABLE) ×3 IMPLANT
TRAY FOLEY CATH 14FR (SET/KITS/TRAYS/PACK) ×3 IMPLANT
WATER STERILE IRR 1000ML POUR (IV SOLUTION) IMPLANT
YANKAUER SUCT BULB TIP NO VENT (SUCTIONS) ×3 IMPLANT

## 2012-05-20 NOTE — Anesthesia Preprocedure Evaluation (Addendum)
Anesthesia Evaluation  Patient identified by MRN, date of birth, ID band Patient awake    Reviewed: Allergy & Precautions, H&P , NPO status , Patient's Chart, lab work & pertinent test results  History of Anesthesia Complications Negative for: history of anesthetic complications  Airway Mallampati: II TM Distance: >3 FB Neck ROM: Full    Dental  (+) Teeth Intact, Caps and Dental Advisory Given   Pulmonary pneumonia , COPD breath sounds clear to auscultation  Pulmonary exam normal       Cardiovascular negative cardio ROS  Rhythm:Regular Rate:Normal     Neuro/Psych Anxiety Depression    GI/Hepatic (+) Hepatitis -  Endo/Other  Diabetes mellitus-, Type 1  Renal/GU      Musculoskeletal   Abdominal   Peds  Hematology   Anesthesia Other Findings   Reproductive/Obstetrics                          Anesthesia Physical Anesthesia Plan  ASA: III  Anesthesia Plan: General   Post-op Pain Management:    Induction: Intravenous  Airway Management Planned: Oral ETT  Additional Equipment:   Intra-op Plan:   Post-operative Plan:   Informed Consent: I have reviewed the patients History and Physical, chart, labs and discussed the procedure including the risks, benefits and alternatives for the proposed anesthesia with the patient or authorized representative who has indicated his/her understanding and acceptance.   Dental advisory given  Plan Discussed with: CRNA, Anesthesiologist and Surgeon  Anesthesia Plan Comments:         Anesthesia Quick Evaluation

## 2012-05-20 NOTE — Transfer of Care (Signed)
Immediate Anesthesia Transfer of Care Note  Patient: Tiffany Hopkins  Procedure(s) Performed: Procedure(s) (LRB): TRANSFORAMINAL LUMBAR INTERBODY FUSION (TLIF) WITH PEDICLE SCREW FIXATION 1 LEVEL (Right)  Patient Location: PACU  Anesthesia Type: General  Level of Consciousness: awake, alert , oriented and patient cooperative  Airway & Oxygen Therapy: Patient Spontanous Breathing and Patient connected to face mask oxygen  Post-op Assessment: Report given to PACU RN, Post -op Vital signs reviewed and stable and Patient moving all extremities  Post vital signs: Reviewed and stable  Complications: No apparent anesthesia complications

## 2012-05-20 NOTE — H&P (Signed)
PREOPERATIVE H&P  Chief Complaint: right leg pain  HPI: Tiffany Hopkins is a 56 y.o. female who presents with right leg pain x 5 months  Past Medical History  Diagnosis Date  . Anxiety   . Depression   . COPD (chronic obstructive pulmonary disease)   . Pneumonia     hx.  . Diabetes mellitus   . Seizures     as child epilopsy none as adult  . Arthritis   . Hepatitis   . Heart murmur     no  cardiologist   Past Surgical History  Procedure Date  . Abdominal hysterectomy   . Cholecystectomy   . Fracture surgery     right hip fracture,pins  . Rotator cuff repair     right  . Bone spur   .  blood cloth removed     right shoulder  . Tendonititis     both hands  . Eye surgery bleophoplasty bilateral  . Bladder suspension    History   Social History  . Marital Status: Married    Spouse Name: N/A    Number of Children: N/A  . Years of Education: N/A   Social History Main Topics  . Smoking status: None  . Smokeless tobacco: None  . Alcohol Use: No  . Drug Use: No  . Sexually Active:      smokes electronic cigatettes   Other Topics Concern  . None   Social History Narrative  . None   History reviewed. No pertinent family history. Allergies  Allergen Reactions  . Hydromorphone Hcl Itching and Rash    Makes skin feel like spiders crawling over her   Prior to Admission medications   Medication Sig Start Date End Date Taking? Authorizing Provider  atorvastatin (LIPITOR) 10 MG tablet Take 10 mg by mouth at bedtime.   Yes Historical Provider, MD  cetirizine (ZYRTEC) 10 MG tablet Take 10 mg by mouth 2 (two) times daily.   Yes Historical Provider, MD  insulin detemir (LEVEMIR) 100 UNIT/ML injection Inject 90 Units into the skin at bedtime.   Yes Historical Provider, MD  insulin glulisine (APIDRA) 100 UNIT/ML injection Inject 40 Units into the skin 3 (three) times daily before meals.   Yes Historical Provider, MD  l-methylfolate-B6-B12 (METANX) 3-35-2 MG TABS Take 1  tablet by mouth daily.   Yes Historical Provider, MD  lamoTRIgine (LAMICTAL) 150 MG tablet Take 150 mg by mouth at bedtime.   Yes Historical Provider, MD  lisinopril (PRINIVIL,ZESTRIL) 5 MG tablet Take 5 mg by mouth daily.   Yes Historical Provider, MD  montelukast (SINGULAIR) 10 MG tablet Take 10 mg by mouth at bedtime.   Yes Historical Provider, MD  Vilazodone HCl (VIIBRYD) 40 MG TABS Take 40 mg by mouth at bedtime.   Yes Historical Provider, MD     All other systems have been reviewed and were otherwise negative with the exception of those mentioned in the HPI and as above.  Physical Exam: Filed Vitals:   05/20/12 1039  BP: 122/78  Pulse:   Temp:   Resp:     General: Alert, no acute distress Cardiovascular: No pedal edema Respiratory: No cyanosis, no use of accessory musculature GI: No organomegaly, abdomen is soft and non-tender Skin: No lesions in the area of chief complaint Neurologic: Sensation intact distally Psychiatric: Patient is competent for consent with normal mood and affect Lymphatic: No axillary or cervical lymphadenopathy  MUSCULOSKELETAL: + right SLR  Assessment/Plan: Low back pain, right leg  pain Plan for Procedure(s): POSTERIOR LUMBAR FUSION 1 LEVEL   Emilee Hero, MD 05/20/2012 11:43 AM

## 2012-05-20 NOTE — Anesthesia Postprocedure Evaluation (Signed)
  Anesthesia Post-op Note  Patient: Tiffany Hopkins  Procedure(s) Performed: Procedure(s) (LRB): TRANSFORAMINAL LUMBAR INTERBODY FUSION (TLIF) WITH PEDICLE SCREW FIXATION 1 LEVEL (Right)  Patient Location: PACU  Anesthesia Type: General  Level of Consciousness: awake  Airway and Oxygen Therapy: Patient Spontanous Breathing  Post-op Pain: mild  Post-op Assessment: Post-op Vital signs reviewed  Post-op Vital Signs: Reviewed  Complications: No apparent anesthesia complications

## 2012-05-20 NOTE — Preoperative (Signed)
Beta Blockers   Reason not to administer Beta Blockers:Not Applicable 

## 2012-05-20 NOTE — Progress Notes (Signed)
Discussed meds with patient.  Confirmed Dilaudid allergy.  Pt states that she does not have a problem with morphine and "It works good."

## 2012-05-21 LAB — GLUCOSE, CAPILLARY
Glucose-Capillary: 257 mg/dL — ABNORMAL HIGH (ref 70–99)
Glucose-Capillary: 217 mg/dL — ABNORMAL HIGH (ref 70–99)
Glucose-Capillary: 168 mg/dL — ABNORMAL HIGH (ref 70–99)
Glucose-Capillary: 103 mg/dL — ABNORMAL HIGH (ref 70–99)
Glucose-Capillary: 218 mg/dL — ABNORMAL HIGH (ref 70–99)

## 2012-05-21 LAB — CBC
MCHC: 31.9 g/dL (ref 30.0–36.0)
Platelets: 155 10*3/uL (ref 150–400)
RDW: 14.1 % (ref 11.5–15.5)

## 2012-05-21 MED ORDER — SODIUM CHLORIDE 0.9 % IV BOLUS (SEPSIS)
1000.0000 mL | Freq: Once | INTRAVENOUS | Status: AC
  Administered 2012-05-21: 1000 mL via INTRAVENOUS

## 2012-05-21 NOTE — Progress Notes (Signed)
Physical Therapy Evaluation  Past Medical History  Diagnosis Date  . Anxiety   . Depression   . COPD (chronic obstructive pulmonary disease)   . Pneumonia     hx.  . Diabetes mellitus   . Seizures     as child epilopsy none as adult  . Arthritis   . Hepatitis   . Heart murmur     no  cardiologist   Past Surgical History  Procedure Date  . Abdominal hysterectomy   . Cholecystectomy   . Fracture surgery     right hip fracture,pins  . Rotator cuff repair     right  . Bone spur   .  blood cloth removed     right shoulder  . Tendonititis     both hands  . Eye surgery bleophoplasty bilateral  . Bladder suspension     05/21/12 0800  PT Visit Information  Last PT Received On 05/21/12  Assistance Needed +1  PT Time Calculation  PT Start Time 1610  PT Stop Time 0901  PT Time Calculation (min) 29 min  Subjective Data  Subjective I had to start using my walker for the last month.    Patient Stated Goal Home without pain  Precautions  Precautions Back  Precaution Booklet Issued Yes (comment)  Restrictions  Weight Bearing Restrictions No  Home Living  Lives With Spouse  Available Help at Discharge Family;Available 24 hours/day  Type of Home House  Home Access Stairs to enter  Entrance Stairs-Number of Steps 4  Entrance Stairs-Rails Right;Left  Home Layout One level  Bathroom Shower/Tub Tub/shower unit;Curtain  Horticulturist, commercial No  Home Adaptive Equipment Raised toilet seat with rails;Walker - four wheeled (No handles on toilet riser)  Prior Function  Level of Independence Needs assistance  Needs Assistance Light Housekeeping (Sits for meal prep)  Able to Take Stairs? Yes  Driving Yes  Vocation Unemployed  Communication  Communication No difficulties  Cognition  Overall Cognitive Status Appears within functional limits for tasks assessed/performed  Arousal/Alertness Awake/alert  Orientation Level Oriented X4 / Intact    Behavior During Session [facility] for tasks performed  Right Lower Extremity Assessment  RLE ROM/Strength/Tone [facility] for tasks assessed  RLE Sensation WFL - Light Touch  Left Lower Extremity Assessment  LLE ROM/Strength/Tone WFL for tasks assessed  LLE Sensation WFL - Light Touch  Bed Mobility  Bed Mobility Not assessed  Transfers  Transfers Sit to Stand;Stand to Sit  Sit to Stand 4: Min assist;With upper extremity assist;From bed  Stand to Sit 4: Min guard;With upper extremity assist;With armrests;To chair/3-in-1  Details for Transfer Assistance cues for UE use, back precautions, controlling descent to chair.    Ambulation/Gait  Ambulation/Gait Assistance 4: Min guard  Ambulation Distance (Feet) 60 Feet  Assistive device Rolling walker  Ambulation/Gait Assistance Details cues for upright posture, RW use, back precautions with turns.    Gait Pattern Step-through pattern;Decreased stride length  Stairs No  Wheelchair Mobility  Wheelchair Mobility No  Balance  Balance Assessed No  PT - End of Session  Equipment Utilized During Treatment Gait belt  Activity Tolerance Patient tolerated treatment well  Patient left in chair;with call bell/phone within reach  Nurse Communication Mobility status  PT Assessment  Clinical Impression Statement pt presents with L4-5 TLIF.  pt moving well and anticipate great progress to return home with family support.    PT Recommendation/Assessment Patient needs continued PT services  PT Problem List Decreased activity tolerance;Decreased balance;Decreased  mobility;Decreased knowledge of use of DME;Decreased knowledge of precautions;Pain;Obesity  Barriers to Discharge None  PT Therapy Diagnosis  Difficulty walking;Acute pain  PT Plan  PT Frequency Min 5X/week  PT Treatment/Interventions DME instruction;Gait training;Stair training;Functional mobility training;Therapeutic activities;Therapeutic exercise;Balance training;Neuromuscular re-education;Patient/family  education  PT Recommendation  Follow Up Recommendations Home health PT  Equipment Recommended None recommended by PT  Individuals Consulted  Consulted and Agree with Results and Recommendations Patient  Acute Rehab PT Goals  PT Goal Formulation With patient  Time For Goal Achievement 05/28/12  Potential to Achieve Goals Good  Pt will Roll Supine to Right Side Independently  PT Goal: Rolling Supine to Right Side - Progress Goal set today  Pt will Roll Supine to Left Side Independently  PT Goal: Rolling Supine to Left Side - Progress Goal set today  Pt will go Supine/Side to Sit Independently  PT Goal: Supine/Side to Sit - Progress Goal set today  Pt will go Sit to Supine/Side Independently  PT Goal: Sit to Supine/Side - Progress Goal set today  Pt will go Sit to Stand with modified independence  PT Goal: Sit to Stand - Progress Goal set today  Pt will go Stand to Sit with modified independence  PT Goal: Stand to Sit - Progress Goal set today  Pt will Ambulate >150 feet;with modified independence;with rolling walker  PT Goal: Ambulate - Progress Goal set today  Pt will Go Up / Down Stairs 3-5 stairs;with supervision;with rail(s)  PT Goal: Up/Down Stairs - Progress Goal set today  Additional Goals  Additional Goal #1 pt will verbalize and follow back precautions.    PT Goal: Additional Goal #1 - Progress Goal set today  PT General Charges  $$ ACUTE PT VISIT 1 Procedure  PT Evaluation  $Initial PT Evaluation Tier II 1 Procedure    Morgaine Kimball, PT [phone]

## 2012-05-21 NOTE — Progress Notes (Signed)
Patient reports resolved right leg pain.    BP 117/82  Pulse 85  Temp 98.1 F (36.7 C) (Oral)  Resp 16  SpO2 96%  Dressing CDI NVI  Drain output 135/12 hours  POD #1 after L4/5 decompression/fusion  - up with PT/OT today - back precautions - SCDs - Percocet/Valium/Percocet

## 2012-05-21 NOTE — Progress Notes (Signed)
Utilization review completed. Tierany Appleby, RN, BSN. 

## 2012-05-21 NOTE — Evaluation (Signed)
Occupational Therapy Evaluation Patient Details Name: Tiffany Hopkins MRN: [id number] DOB: 05/15/1956 Today's Date: 05/21/2012 Time: 3086-5784 OT Time Calculation (min): 30 min  OT Assessment / Plan / Recommendation Clinical Impression  Patient is a 56 y/o female s/p Posterior Lumber Fusion Level 1 presenting to acute OT with deficits below. Patient will benefit from acute OT services to increase functional performance during BADL, functional mobility, strength , endurance, and functional transfers. Patient's blood pressure was low during OT eval (72/45) and nursing advised therapist not to get patient up out of bed. Patient did tolerate washing up sitting on EOB.     OT Assessment  Patient needs continued OT Services    Follow Up Recommendations  No OT follow up    Barriers to Discharge None    Equipment Recommendations  Tub/shower bench       Frequency  Min 2X/week    Precautions / Restrictions Precautions Precautions: Back;Fall   Pertinent Vitals/Pain 7/10 pain level in back. Patient reports receiving pain meds recently.    ADL  Grooming: Performed;Wash/dry face;Wash/dry hands;Set up;Teeth care Where Assessed - Grooming: Unsupported sitting Upper Body Bathing: Performed;Chest;Right arm;Left arm;Abdomen;Supervision/safety Where Assessed - Upper Body Bathing: Unsupported sitting Lower Body Bathing: Performed;Minimal assistance Where Assessed - Lower Body Bathing: Supported sit to stand Upper Body Dressing: Performed;Supervision/safety Where Assessed - Upper Body Dressing: Unsupported sitting Lower Body Dressing: Performed;Minimal assistance Where Assessed - Lower Body Dressing: Supported sit to stand Transfers/Ambulation Related to ADLs: Patient unable to transfer at this time per nursing due to low Blood Pressure.     OT Diagnosis: Generalized weakness  OT Problem List: Decreased strength;Decreased activity tolerance;Impaired balance (sitting and/or standing);Decreased  knowledge of use of DME or AE;Decreased knowledge of precautions;Pain OT Treatment Interventions: Self-care/ADL training;Therapeutic exercise;Energy conservation;DME and/or AE instruction;Therapeutic activities;Patient/family education;Balance training   OT Goals Acute Rehab OT Goals OT Goal Formulation: With patient Time For Goal Achievement: 05/28/12 Potential to Achieve Goals: Good ADL Goals Pt Will Perform Lower Body Bathing: with supervision;Sit to stand from bed ADL Goal: Lower Body Bathing - Progress: Goal set today Pt Will Perform Lower Body Dressing: with supervision;Sit to stand from bed ADL Goal: Lower Body Dressing - Progress: Goal set today Pt Will Transfer to Toilet: with supervision;Raised toilet seat with arms;Maintaining back safety precautions;Ambulation ADL Goal: Toilet Transfer - Progress: Goal set today Pt Will Perform Toileting - Clothing Manipulation: with supervision;Sitting on 3-in-1 or toilet ADL Goal: Toileting - Clothing Manipulation - Progress: Goal set today Pt Will Perform Toileting - Hygiene: with supervision;Sit to stand from 3-in-1/toilet ADL Goal: Toileting - Hygiene - Progress: Goal set today Pt Will Perform Tub/Shower Transfer: Tub transfer;with supervision;Transfer tub bench;Maintaining back safety precautions;Ambulation ADL Goal: Tub/Shower Transfer - Progress: Goal set today  Visit Information  Last OT Received On: 05/21/12 Assistance Needed: +1    Subjective Data  Subjective: "I don't feel dizzy." Patient Stated Goal: To go home.   Prior Functioning  Home Living Lives With: Spouse Available Help at Discharge: Family;Available 24 hours/day Type of Home: House Home Access: Stairs to enter [employer] of Steps: 3 Entrance Stairs-Rails: Right;Left Home Layout: One level Bathroom Shower/Tub: Forensic scientist: Standard (with toilet seat riser) Bathroom Accessibility: No Home Adaptive Equipment: Walker -  four wheeled (raised toilet seat no handles) Additional Comments: Patient leaves walker outside of bathroom at home and walks into bathroom. Prior Function Level of Independence: Needs assistance Needs Assistance: Bathing Bath: Supervision/set-up (Husband is present during showers.) Able to Take Stairs?: Yes  Driving: Yes Vocation: Unemployed Communication Communication: No difficulties Dominant Hand: Right    Cognition  Overall Cognitive Status: Appears within functional limits for tasks assessed/performed Arousal/Alertness: Awake/alert Orientation Level: Oriented X4 / Intact Behavior During Session: [facility] for tasks performed       Mobility Bed Mobility Bed Mobility: Rolling Left;Supine to Sit;Sitting - Scoot to Edge of Bed Rolling Left: 4: Min assist;With rail Supine to Sit: 4: Min assist;HOB flat;With rails Sitting - Scoot to Edge of Bed: 5: Supervision;With rail Transfers Transfers: Not assessed (due to low blood pressure)         End of Session OT - End of Session Activity Tolerance: Treatment limited secondary to medical complications (Comment);Patient limited by fatigue (low blood pressure ) Patient left: with call bell/phone within reach;in bed;with family/visitor present    Jeanene Erb, OTR/L [phone] 05/21/2012, 3:16 PM

## 2012-05-21 NOTE — Progress Notes (Signed)
Dr Yevette Edwards notified of BP 70s systolic - order received. Pt asymptomatic.

## 2012-05-21 NOTE — Op Note (Signed)
Tiffany Hopkins, Tiffany Hopkins               ACCOUNT NO.:  [id number]  MEDICAL RECORD NO.:  [id number]  LOCATION:  [id number]                        FACILITY:  [facility]  PHYSICIAN:  Estill Bamberg, MD      DATE OF BIRTH:  Oct 02, 1956  DATE OF PROCEDURE:  05/20/2012 DATE OF DISCHARGE:                              OPERATIVE REPORT   PREOPERATIVE DIAGNOSES: 1. L4-5 grade 1 spondylolisthesis. 2. 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 3. Degenerative disk disease, L4-5.  SURGERY: 1. Right-sided L4-5 transforaminal lumbar interbody fusion. 2. Left-sided posterolateral L4-5 fusion. 3. Placement of posterior instrumentation L4, L5. 4. Placement of interbody device x1 (12 mm parallel Concorde bulleted     cage). 5. Use of local autograft. 6. Use of morselized allograft. 7. Intraoperative use of fluoroscopy.  SURGEON:  Estill Bamberg, MD  ASSISTANT:  Skip Mayer, PA-C  ANESTHESIA:  General endotracheal anesthesia.  COMPLICATIONS:  None.  DISPOSITION:  Stable.  ESTIMATED BLOOD LOSS:  300 mL.  INDICATIONS FOR PROCEDURE:  Briefly, Tiffany Hopkins is a very pleasant 56- year-old female who did present to me with severe and debilitating pain in her right leg on May 07, 2012.  She did have conservative care in the form of therapy and a right-sided nerve block and continued to have ongoing pain.  I did review an MRI, which was clearly notable for a facet cyst on the right side, clearly causing compression of the traversing nerve.  A spondylolisthesis was also noted on radiographs. Given the patient's failure of ongoing conservative care, we did make a decision to go forward with surgery in the form of a transforaminal lumbar interbody fusion.  The patient fully understood the risks and limitations of the procedure as outlined in my preoperative note.  OPERATIVE DETAILS:  On May 20, 2012, the patient was brought to surgery and general endotracheal  anesthesia was administered.  The patient was placed prone on a well-padded flat Jackson bed with a Wilson frame. Antibiotics were given and SCDs were placed and a time-out procedure was performed.  I did obtain a lateral intraoperative radiograph to confirm appropriate trajectory of the L4 and L5 screws.  I then made an incision over the midline.  The fascia was sharply incised in the midline and bluntly swept laterally.  The transverse processes of L4 and L5 were subperiosteally exposed as were the L4-5 facet joints and the lamina of L4 and L5.  I then cannulated the L4 and L5 pedicles in the usual fashion using a high-speed bur in addition to a gearshift probe and a 6- mm tap.  This was done on both the right and the left sides.  I did use a ball-tip probe to confirm that there was no cortical violation of the screws.  On the left side, I used a 4-mm high-speed bur to decorticate the posterior elements in the transverse processes and the facet joint. A 7 x 45 mm screws were then placed.  A 40-mm rod was then placed and distraction was applied across the rod.  I then turned my attention towards the right side.  On the right side, a  full facetectomy was performed.  I did entirely remove the inferior articular process of L4 and the superior articular process of L5.  Of note, the facet cyst was readily identified, was clearly noted to be very much adherent to the dura and the surrounding structures.  I did feel that removing the entirety of the cyst would bring very high risk of a cerebrospinal fluid leak.  I did therefore make a decision to remove the bone posterior to and to debulk the cyst using a nerve hook.  At the end of this portion of the procedure, I did identify obvious decompression of the lateral recess.  I then identified the exiting L4 nerve, the traversing L5 nerve.  The nerves were gently retracted and I did perform an annulotomy at the posterolateral aspect at the right  side of the L4-5 intervertebral disk.  A diskectomy was performed in the usual manner using a series of curettes and pituitary rongeur's.  After completely preparing the endplates above and below, I did place a series of trials and did feel that a 12 mm trial interbody implant would be the most appropriate fit.  At this point, the interbody space was packed with autograft obtained for removing the facet joint in addition to the allograft in the form of DBX mix.  The interbody device was also packed and was tamped into position in the usual fashion using both AP and lateral fluoroscopy.  I was very pleased with the final appearance on both AP and lateral fluoroscopic views.  Of note, DBX mix and allograft chips were placed into the left posterolateral gutter prior to placing the screws and after decorticating.  I then placed 7 x 45 mm screws at L4 and L5.  Distraction was discontinued on the contralateral side.  A 40-mm rod was placed on the right and caps were placed and a final locking procedure was performed.  Caps were then placed on the left side and a final locking procedure was performed on that side as well.  I was very pleased with the final appearance on both the AP and lateral radiographs.  Of note I, did test the screws on the right side using triggered EMG and there was no screw that tested below 15 milliamps. Also of note, I did use neurologic monitoring throughout the procedure and there were no abnormal responses identified.  At this point, I did explore the wound for any undue bleeding and there was none.  Also of note, prior to performing the annulotomy, I did identify significantly engorged veins with rather significant epidural bleeding encountered. It did take approximately 30 minutes to pack off the bleeding and controlled the bleeding using bipolar electrocautery, however, this was ultimately controlled uneventfully.  I did place #15 Blake drain deep to the fascia.   The fascia was then closed using #1 Vicryl.  The subcutaneous layer was closed using 2-0 Vicryl and the skin was closed using 3-0 Monocryl.  Of note I, did copiously irrigate the wound approximately every 60 minutes and retractors were relaxed approximately every 30 minutes.  Also of note, Skip Mayer was my assistant throughout the procedure and aided in essential retraction and suctioning required throughout the surgery.     Estill Bamberg, MD     MD/MEDQ  D:  05/20/2012  T:  05/21/2012  Job:  [id number]  cc:   Marjorie Smolder, MD

## 2012-05-21 NOTE — Progress Notes (Signed)
Inpatient Diabetes Program Recommendations  AACE/ADA: New Consensus Statement on Inpatient Glycemic Control  Target Ranges:  Prepandial:   less than 140 mg/dL      Peak postprandial:   less than 180 mg/dL (1-2 hours)      Critically ill patients:  140 - 180 mg/dL  Pager:  [phone] Hours:  8 am-10pm   Reason for Visit: Patient on Levemir and Novolog TID  Inpatient Diabetes Program Recommendations Correction (SSI): Please order CBGs while on insulin    Alfredia Client PhD, RN Diabetes Coordinator  Office:  [phone] Team Pager:  [phone]

## 2012-05-22 LAB — GLUCOSE, CAPILLARY: Glucose-Capillary: 247 mg/dL — ABNORMAL HIGH (ref 70–99)

## 2012-05-22 MED ORDER — OXYCODONE HCL 10 MG PO TB12: 10.0000 mg | ORAL_TABLET | Freq: Two times a day (BID) | ORAL | Status: DC

## 2012-05-22 NOTE — Progress Notes (Signed)
Subjective: 2 Days Post-Op Procedure(s) (LRB): TRANSFORAMINAL LUMBAR INTERBODY FUSION (TLIF) WITH PEDICLE SCREW FIXATION 1 LEVEL (Right) Patient reports pain as moderate.    Objective: Vital signs in last 24 hours: Temp:  [98.1 F (36.7 C)-99.1 F (37.3 C)] 98.3 F (36.8 C) (06/29 0606) Pulse Rate:  [90-98] 98  (06/29 0606) Resp:  [16-18] 18  (06/29 0606) BP: (78-118)/(50-62) 105/58 mmHg (06/29 0606) SpO2:  [86 %-91 %] 86 % (06/29 0606)  Intake/Output from previous day: 06/28 0701 - 06/29 0700 In: 720 [P.O.:720] Out: 50 [Drains:50] Intake/Output this shift:     Basename 05/21/12 1811  HGB 11.1*    Basename 05/21/12 1811  WBC 9.6  RBC 3.69*  HCT 34.8*  PLT 155   No results found for this basename: NA:2,K:2,CL:2,CO2:2,BUN:2,CREATININE:2,GLUCOSE:2,CALCIUM:2 in the last 72 hours No results found for this basename: LABPT:2,INR:2 in the last 72 hours  Neurologically intact Neurovascular intact Sensation intact distally Intact pulses distally Dorsiflexion/Plantar flexion intact No cellulitis present Drain 35 cc over last shift Assessment/Plan: 2 Days Post-Op Procedure(s) (LRB): TRANSFORAMINAL LUMBAR INTERBODY FUSION (TLIF) WITH PEDICLE SCREW FIXATION 1 LEVEL (Right) pull drain monitor bp at home and resume BP meds when systolic BP over 161 Discharge home today  Corderius Saraceni L 05/22/2012, 8:41 AM

## 2012-05-22 NOTE — Progress Notes (Signed)
Obtained order for Care management, Home Health PT, and Tub bench.  Spoke with Care management to facilitate patient needs at discharge for later this AM.

## 2012-05-22 NOTE — Progress Notes (Signed)
OT Cancellation Note  Treatment cancelled today due to patient's refusal to participate: pt was preparing to d/c home and declined OT.  Glendale Chard, OTR/L Pager: [phone] 05/22/2012    Tiffany Hopkins 05/22/2012, 11:53 AM

## 2012-05-22 NOTE — Progress Notes (Signed)
   CARE MANAGEMENT NOTE 05/22/2012  Patient:  Tiffany Hopkins, Tiffany Hopkins   Account Number:  [id number]  Date Initiated:  05/22/2012  Documentation initiated by:  [facility]  Subjective/Objective Assessment:   TRANSFORAMINAL LUMBAR INTERBODY FUSION (TLIF) WITH PEDICLE SCREW FIXATION     Action/Plan:   Anticipated DC Date:  05/22/2012   Anticipated DC Plan:  HOME W HOME HEALTH SERVICES      DC Planning Services  CM consult      [facility] Choice  HOME HEALTH   Choice offered to / List presented to:  C-1 Patient   DME arranged  TUB BENCH  3-N-1      DME agency  [facility]     HH arranged  HH-2 PT      [facility] agency  [facility]   Status of service:  Completed, signed off Medicare Important Message given?   (If response is "NO", the following Medicare IM given date fields will be blank) Date Medicare IM given:   Date Additional Medicare IM given:    Discharge Disposition:  HOME W HOME HEALTH SERVICES  Per UR Regulation:    If discussed at Long Length of Stay Meetings, dates discussed:    Comments:  late entry 05/22/2012 1100 Spoke to pt. NCM offered choice for [facility] agency. Reviewed agencies that will accept her insurance coverage. States she request [facility] for [facility]. Contacted [facility] for DME and HH PT for scheduled d/c today. Provided [facility] information on d/c instructions. Isidoro Donning RN CCM Case Mgmt phone [phone]

## 2012-05-22 NOTE — Progress Notes (Signed)
PT Treatment and Discharge Summary   05/22/12 1003  PT Visit Information  Last PT Received On 05/22/12  Assistance Needed +1  PT Time Calculation  PT Start Time 0931  PT Stop Time 1007  PT Time Calculation (min) 36 min  Subjective Data  Subjective I just wish it didn't hurt  Patient Stated Goal Home without pain  Precautions  Precautions Back;Fall  Precaution Booklet Issued No  Precaution Comments pt able to recall 2/3 precautions, vc's given for twisting  Restrictions  Weight Bearing Restrictions No  Cognition  Overall Cognitive Status Appears within functional limits for tasks assessed/performed  Arousal/Alertness Awake/alert  Orientation Level Oriented X4 / Intact  Behavior During Session [facility] for tasks performed  Bed Mobility  Bed Mobility Not assessed (discussed with keeping precautions)  Transfers  Transfers Sit to Stand;Stand to Sit  Sit to Stand 5: Supervision;From bed;From chair/3-in-1;With upper extremity assist  Stand to Sit 5: Supervision;With upper extremity assist;To chair/3-in-1;To bed  Details for Transfer Assistance pt has progressed well with transfers, no physical assist needed  Ambulation/Gait  Ambulation/Gait Assistance 5: Supervision  Ambulation Distance (Feet) 350 Feet  Assistive device Rolling walker  Ambulation/Gait Assistance Details cues for upright posture  Gait Pattern Step-through pattern;Decreased stride length  Gait velocity decreased with several standing rest breaks  Stairs Yes  Stairs Assistance 5: Supervision  Stairs Assistance Details (indicate cue type and reason) vc's for sequencing and use of rail  Stair Management Technique One rail Right;Step to pattern;Forwards  Number of Stairs 5   Engineering geologist No  Balance  Balance Assessed Yes  Dynamic Standing Balance  Dynamic Standing - Balance Support During functional activity;Bilateral upper extremity supported  Dynamic  Standing - Level of Assistance 5: Stand by assistance  PT - End of Session  Equipment Utilized During Treatment Gait belt  Activity Tolerance Patient tolerated treatment well  Patient left in bed;with call bell/phone within reach;with family/visitor present  Nurse Communication Mobility status  PT - Assessment/Plan  Comments on Treatment Session Pt continues with significant pain but has progressed well from a functional standpoint and is ready for d/c.  Discussed d/c issues including chair selection at home, sleeping, amount of mobility.  Pt reports need for a tub bench for d/c and PT recommends HHPT.  PT signing off for d/c.  PT Plan Discharge plan remains appropriate;Frequency remains appropriate  PT Frequency Min 5X/week  Follow Up Recommendations Home health PT  Equipment Recommended Tub/shower bench  Acute Rehab PT Goals  PT Goal Formulation With patient  Time For Goal Achievement 05/28/12  Potential to Achieve Goals Good  Pt will Roll Supine to Right Side Independently  Pt will Roll Supine to Left Side Independently  Pt will go Supine/Side to Sit Independently  Pt will go Sit to Supine/Side Independently  Pt will go Sit to Stand with modified independence  PT Goal: Sit to Stand - Progress Progressing toward goal  Pt will go Stand to Sit with modified independence  PT Goal: Stand to Sit - Progress Progressing toward goal  Pt will Ambulate >150 feet;with modified independence;with rolling walker  PT Goal: Ambulate - Progress Progressing toward goal  Pt will Go Up / Down Stairs 3-5 stairs;with supervision;with rail(s)  PT Goal: Up/Down Stairs - Progress Met  Additional Goals  Additional Goal #1 pt will verbalize and follow back precautions.    PT Goal: Additional Goal #1 - Progress Progressing toward goal  PT General Charges  $$ ACUTE PT VISIT 1  Procedure  PT Treatments  $Gait Training 23-37 mins  8/10 back pain   Lyanne Co, PT  Acute Rehab Services  [phone]

## 2012-05-24 MED FILL — Heparin Sodium (Porcine) Inj 1000 Unit/ML: INTRAMUSCULAR | Qty: 30 | Status: AC

## 2012-05-24 MED FILL — Sodium Chloride IV Soln 0.9%: INTRAVENOUS | Qty: 1000 | Status: AC

## 2012-05-24 MED FILL — Sodium Chloride Irrigation Soln 0.9%: Qty: 3000 | Status: AC

## 2012-05-26 NOTE — Discharge Summary (Signed)
NAMESHELISA, FERN               ACCOUNT NO.:  [id number]  MEDICAL RECORD NO.:  [id number]  LOCATION:  5N20C                        FACILITY:  [facility]  PHYSICIAN:  Estill Bamberg, MD      DATE OF BIRTH:  [birth year]  DATE OF ADMISSION:  05/20/2012 DATE OF DISCHARGE:  05/22/2012                              DISCHARGE SUMMARY   ADMISSION DIAGNOSES: 1. Right-sided L4-5 facet cyst. 2. L4-5 degenerative disk disease. 3. Grade 1 L4-5 spondylolisthesis. 4. Right-sided L5 radiculopathy.  DISCHARGE DIAGNOSES: 1. Right-sided L4-5 facet cyst. 2. L4-5 degenerative disk disease. 3. Grade 1 L4-5 spondylolisthesis. 4. Right-sided L5 radiculopathy.  ADMITTING PHYSICIAN:  Estill Bamberg, MD.  ADMISSION HISTORY:  Briefly, Ms. Nanni is a pleasant 56 year old female who presented to me with severe debilitating pain in her right leg.  I did review an MRI and radiographs, which were notable for grade 1 spondylolisthesis as well as compression of the right-sided L5 nerve. The patient failed multiple forms of conservative care and was admitted on May 20, 2012 for operative intervention.  HOSPITAL COURSE:  On February 18, 2012, the patient was brought to surgery and underwent a right-sided L4-5 transforaminal lumbar interbody fusion. The patient tolerated the procedure well and was transferred to recovery in stable condition.  The patient was evaluated by me on the morning of postoperative day #1.  The patient's pain was very well controlled.  The patient reported complete resolution of right leg pain.  A deep lumbar drain was placed and was maintained until postop day #2.  The drain was removed on postoperative day #2.  The patient was mobilized with physical therapy each day throughout her hospital stay.  On the morning of postoperative day #2, it was felt that the patient's pain was well controlled and the patient was uneventfully discharged home.  DISCHARGE INSTRUCTIONS:  The patient will take  Percocet for pain and Valium for spasms.  The patient will follow up with me in approximately 2 weeks after her procedure.  She will adhere to back precautions at all times.  She will contact my office with any questions or concerns regarding her recovery.     Estill Bamberg, MD     MD/MEDQ  D:  05/26/2012  T:  05/26/2012  Job:  [id number]

## 2013-01-05 ENCOUNTER — Other Ambulatory Visit: Payer: Self-pay | Admitting: Orthopedic Surgery

## 2013-01-05 DIAGNOSIS — M549 Dorsalgia, unspecified: Secondary | ICD-10-CM

## 2013-01-13 ENCOUNTER — Ambulatory Visit
Admission: RE | Admit: 2013-01-13 | Discharge: 2013-01-13 | Disposition: A | Discharge status: Home / Self Care | Payer: BC Managed Care – PPO | Source: Ambulatory Visit | Attending: Orthopedic Surgery | Admitting: Orthopedic Surgery

## 2013-01-13 VITALS — BP 122/53 | HR 78

## 2013-01-13 DIAGNOSIS — M549 Dorsalgia, unspecified: Secondary | ICD-10-CM

## 2013-01-13 MED ORDER — IOHEXOL 180 MG/ML  SOLN
15.0000 mL | Freq: Once | INTRAMUSCULAR | Status: AC | PRN
  Administered 2013-01-13: 15 mL via INTRATHECAL

## 2013-01-13 MED ORDER — DIAZEPAM 5 MG PO TABS
10.0000 mg | ORAL_TABLET | Freq: Once | ORAL | Status: AC
  Administered 2013-01-13: 10 mg via ORAL

## 2013-01-13 NOTE — Progress Notes (Signed)
Pt states she has been off vibryd for the past 2 days.

## 2014-11-24 HISTORY — PX: BREAST LUMPECTOMY: SHX2

## 2014-12-04 ENCOUNTER — Other Ambulatory Visit: Payer: Self-pay | Admitting: Anesthesiology

## 2014-12-04 ENCOUNTER — Ambulatory Visit
Admission: RE | Admit: 2014-12-04 | Discharge: 2014-12-04 | Disposition: A | Discharge status: Home / Self Care | Payer: BLUE CROSS/BLUE SHIELD | Source: Ambulatory Visit | Attending: Anesthesiology | Admitting: Anesthesiology

## 2014-12-04 DIAGNOSIS — Z981 Arthrodesis status: Secondary | ICD-10-CM

## 2015-05-21 ENCOUNTER — Other Ambulatory Visit: Payer: Self-pay | Admitting: Anesthesiology

## 2015-05-21 DIAGNOSIS — M545 Low back pain: Secondary | ICD-10-CM

## 2015-06-08 ENCOUNTER — Ambulatory Visit
Admission: RE | Admit: 2015-06-08 | Discharge: 2015-06-08 | Disposition: A | Discharge status: Home / Self Care | Payer: BLUE CROSS/BLUE SHIELD | Source: Ambulatory Visit | Attending: Anesthesiology | Admitting: Anesthesiology

## 2015-06-08 ENCOUNTER — Inpatient Hospital Stay: Admission: RE | Admit: 2015-06-08 | Payer: BLUE CROSS/BLUE SHIELD | Source: Ambulatory Visit

## 2015-06-08 DIAGNOSIS — M545 Low back pain: Secondary | ICD-10-CM

## 2015-08-13 ENCOUNTER — Other Ambulatory Visit: Payer: Self-pay | Admitting: Specialist

## 2015-08-13 DIAGNOSIS — M545 Low back pain: Secondary | ICD-10-CM

## 2015-08-27 ENCOUNTER — Ambulatory Visit
Admission: RE | Admit: 2015-08-27 | Discharge: 2015-08-27 | Disposition: A | Discharge status: Home / Self Care | Payer: BLUE CROSS/BLUE SHIELD | Source: Ambulatory Visit | Attending: Specialist | Admitting: Specialist

## 2015-08-27 DIAGNOSIS — M545 Low back pain: Secondary | ICD-10-CM

## 2015-08-27 MED ORDER — GADOBENATE DIMEGLUMINE 529 MG/ML IV SOLN
20.0000 mL | Freq: Once | INTRAVENOUS | Status: AC | PRN
  Administered 2015-08-27: 20 mL via INTRAVENOUS

## 2016-01-15 SURGERY — Surgical Case
Anesthesia: *Unknown

## 2016-01-16 ENCOUNTER — Other Ambulatory Visit: Payer: Self-pay | Admitting: Specialist

## 2016-01-17 ENCOUNTER — Encounter (HOSPITAL_COMMUNITY)
Admission: RE | Admit: 2016-01-17 | Discharge: 2016-01-17 | Disposition: A | Discharge status: Home / Self Care | Payer: Managed Care, Other (non HMO) | Source: Ambulatory Visit | Attending: Specialist | Admitting: Specialist

## 2016-01-17 ENCOUNTER — Encounter (HOSPITAL_COMMUNITY): Payer: Self-pay

## 2016-01-17 DIAGNOSIS — Z01812 Encounter for preprocedural laboratory examination: Secondary | ICD-10-CM | POA: Insufficient documentation

## 2016-01-17 DIAGNOSIS — Z0183 Encounter for blood typing: Secondary | ICD-10-CM | POA: Diagnosis not present

## 2016-01-17 DIAGNOSIS — J449 Chronic obstructive pulmonary disease, unspecified: Secondary | ICD-10-CM | POA: Diagnosis not present

## 2016-01-17 DIAGNOSIS — G4733 Obstructive sleep apnea (adult) (pediatric): Secondary | ICD-10-CM | POA: Insufficient documentation

## 2016-01-17 DIAGNOSIS — Z794 Long term (current) use of insulin: Secondary | ICD-10-CM | POA: Diagnosis not present

## 2016-01-17 DIAGNOSIS — Z87891 Personal history of nicotine dependence: Secondary | ICD-10-CM | POA: Diagnosis not present

## 2016-01-17 DIAGNOSIS — Z79899 Other long term (current) drug therapy: Secondary | ICD-10-CM | POA: Diagnosis not present

## 2016-01-17 DIAGNOSIS — M7130 Other bursal cyst, unspecified site: Secondary | ICD-10-CM | POA: Diagnosis not present

## 2016-01-17 DIAGNOSIS — I10 Essential (primary) hypertension: Secondary | ICD-10-CM | POA: Insufficient documentation

## 2016-01-17 DIAGNOSIS — E119 Type 2 diabetes mellitus without complications: Secondary | ICD-10-CM | POA: Insufficient documentation

## 2016-01-17 DIAGNOSIS — Z01818 Encounter for other preprocedural examination: Secondary | ICD-10-CM | POA: Insufficient documentation

## 2016-01-17 HISTORY — DX: Obstructive sleep apnea (adult) (pediatric): G47.33

## 2016-01-17 HISTORY — DX: Essential (primary) hypertension: I10

## 2016-01-17 HISTORY — DX: Other specified postprocedural states: R11.2

## 2016-01-17 HISTORY — DX: Other specified postprocedural states: Z98.890

## 2016-01-17 HISTORY — DX: Acute embolism and thrombosis of unspecified veins of unspecified upper extremity: I82.609

## 2016-01-17 HISTORY — DX: Dependence on other enabling machines and devices: Z99.89

## 2016-01-17 HISTORY — DX: Sleep apnea, unspecified: G47.30

## 2016-01-17 LAB — COMPREHENSIVE METABOLIC PANEL
ALT: 29 U/L (ref 14–54)
ANION GAP: 11 (ref 5–15)
AST: 24 U/L (ref 15–41)
Albumin: 3.8 g/dL (ref 3.5–5.0)
Alkaline Phosphatase: 68 U/L (ref 38–126)
BUN: 11 mg/dL (ref 6–20)
CHLORIDE: 103 mmol/L (ref 101–111)
CO2: 25 mmol/L (ref 22–32)
Calcium: 9.3 mg/dL (ref 8.9–10.3)
Creatinine, Ser: 0.86 mg/dL (ref 0.44–1.00)
GFR calc non Af Amer: >60 mL/min (ref >60)
Glucose, Bld: 145 mg/dL — ABNORMAL HIGH (ref 65–99)
POTASSIUM: 4.2 mmol/L (ref 3.5–5.1)
SODIUM: 139 mmol/L (ref 135–145)
Total Bilirubin: 0.7 mg/dL (ref 0.3–1.2)
Total Protein: 6.5 g/dL (ref 6.5–8.1)

## 2016-01-17 LAB — TYPE AND SCREEN
ABO/RH(D): B POS
ANTIBODY SCREEN: NEGATIVE

## 2016-01-17 LAB — GLUCOSE, CAPILLARY: Glucose-Capillary: 169 mg/dL — ABNORMAL HIGH (ref 65–99)

## 2016-01-17 LAB — CBC
HCT: 49 % — ABNORMAL HIGH (ref 36.0–46.0)
Hemoglobin: 15.6 g/dL — ABNORMAL HIGH (ref 12.0–15.0)
MCH: 29.5 pg (ref 26.0–34.0)
MCHC: 31.8 g/dL (ref 30.0–36.0)
MCV: 92.6 fL (ref 78.0–100.0)
PLATELETS: 166 10*3/uL (ref 150–400)
RBC: 5.29 MIL/uL — AB (ref 3.87–5.11)
RDW: 14.3 % (ref 11.5–15.5)
WBC: 7.9 10*3/uL (ref 4.0–10.5)

## 2016-01-17 LAB — SURGICAL PCR SCREEN
MRSA, PCR: NEGATIVE
STAPHYLOCOCCUS AUREUS: NEGATIVE

## 2016-01-17 NOTE — Progress Notes (Signed)
Patient denies cardiology follow-up/history and cardiac catheterization but does have a letter of clearance in Care Everywhere and in chart from Dr. Rhona Leavens in [facility].  Have requested any cardiac testing and last office visit note. Letter of medical clearance is also in chart.   pcp is Deforest Hoyles, MD

## 2016-01-17 NOTE — Pre-Procedure Instructions (Signed)
Tiffany Hopkins  01/17/2016      [facility] - [place], [place] - [address] [place] [address] Phone: [phone] Fax: [phone]  [id number] [name], [place] - [name] [place] [address] Phone: [phone] Fax: [phone]    Your procedure is scheduled on Tuesday Feb 28th 2017 .  Report to [facility] Admitting at 530 A.M.  Call this number if you have problems the morning of surgery:  [phone]   Remember:  Do not eat food or drink liquids after midnight Monday Feb 27.  Take these medicines the morning of surgery with A SIP OF WATER sertraline (zoloft)  Do not take your invokana the morning of surgery.  STOP: ALL Vitamins, Supplements, Effient and Herbal Medications, Fish Oils, Aspirins, NSAIDs (Nonsteroidal Anti-inflammatories such as Ibuprofen, Aleve, or Advil), and Goody's/BC Powders today until after surgery as directed by your physician.   How to Manage Your Diabetes Before Surgery   Why is it important to control my blood sugar before and after surgery?   Improving blood sugar levels before and after surgery helps healing and can limit problems.  A way of improving blood sugar control is eating a healthy diet by:  - Eating less sugar and carbohydrates  - Increasing activity/exercise  - Talk with your doctor about reaching your blood sugar goals  High blood sugars (greater than 180 mg/dL) can raise your risk of infections and slow down your recovery so you will need to focus on controlling your diabetes during the weeks before surgery.  Make sure that the doctor who takes care of your diabetes knows about your planned surgery including the date and location.  How do I manage my blood sugars before surgery?   Check your blood sugar at least 4 times a day, 2 days before surgery to make sure that they are not too high or low.   Check your blood  sugar the morning of your surgery when you wake up and every 2               hours until you get to the Short-Stay unit.  If your blood sugar is less than 70 mg/dL, you will need to treat for low blood sugar by:  Treat a low blood sugar (less than 70 mg/dL) with 1/2 cup of clear juice (cranberry or apple), 4 glucose tablets, OR glucose gel.  Recheck blood sugar in 15 minutes after treatment (to make sure it is greater than 70 mg/dL).  If blood sugar is not greater than 70 mg/dL on re-check, call [phone] for further instructions.   Report your blood sugar to the Short-Stay nurse when you get to Short-Stay.  References:  [facility], 2007 "How to Manage your Diabetes Before and After Surgery".  What do I do about my diabetes medications?   Do not take oral diabetes medicines (pills) the morning of surgery.    If your CBG is greater than 220 mg/dL, you may take 1/2 of your sliding scale (correction) dose of insulin.   Do not wear jewelry, make-up or nail polish.  Do not wear lotions, powders, or perfumes.  You may wear deodorant.  Do not shave 48 hours prior to surgery.  Men may shave face and neck.  Do not bring valuables to the hospital.  [facility] is not responsible for any belongings or valuables.  Contacts, dentures or  bridgework may not be worn into surgery.  Leave your suitcase in the car.  After surgery it may be brought to your room.  For patients admitted to the hospital, discharge time will be determined by your treatment team.  Patients discharged the day of surgery will not be allowed to drive home.        Preparing for Surgery at [facility]  Before surgery, you can play an important role.  Because skin is not sterile, your skin needs to be as free of germs as possible.  You can reduce the number of germs on your skin by washing with CHG (chlorahexidine gluconate) Soap before surgery.  CHG is an antiseptic cleaner with kills germs and  bonds with the skin to continue killing germs even after washing.   Please do not use if you have an allergy to CHG or antibacterial soaps.  If your skin becomes reddened/irritated stop using the CHG.  Do not shave (including legs and underarms) for at least 48 hours prior to first CHG shower.  It is okay to shave your face.  Please follow these instructions carefully:  1. Shower with CHG Soap the night before surgery and the morning of Surgery. 2. If you choose to wash your hair, wash your hair first as usual with your normal shampoo. 3. After you shampoo, rinse your hair and body thoroughly to remove the Shampoo. 4. Use CHG as you would any other liquid soap. You can apply chg directly to the skin and wash gently with scrungie or a clean washcloth. 5. Apply the CHG Soap to your body ONLY FROM THE NECK DOWN. Do not use on open wounds or open sores. Avoid contact with your eyes, ears, mouth and genitals (private parts). Wash genitals (private parts) with your normal soap. 6. Wash thoroughly, paying special attention to the area where your surgery will be performed. 7. Thoroughly rinse your body with warm water from the neck down. 8. DO NOT shower/wash with your normal soap after using and rinsing off the CHG Soap. 9. Pat yourself dry with a clean towel.  10. Wear clean pajamas.  11. Place clean sheets on your bed the night of your first shower and do not sleep with pets.  Day of Surgery  Do not apply any lotions/deodorants the morning of surgery. Please wear clean clothes to the hospital/surgery center.   Please read over the following fact sheets that you were given. Pain Booklet, Coughing and Deep Breathing, Blood Transfusion Information, MRSA Information and Surgical Site Infection Prevention

## 2016-01-18 LAB — HEMOGLOBIN A1C
HEMOGLOBIN A1C: 8.2 % — AB (ref 4.8–5.6)
MEAN PLASMA GLUCOSE: 189 mg/dL

## 2016-01-18 NOTE — Progress Notes (Signed)
Anesthesia Chart Review:  Pt is a 60 year old female scheduled for removal of loosened hardware L4-5, R L4-5 foraminotomy, redo L4-5 TLIF, L L3-4 resection of synovial cyst, reinsertion of rods and screws, local and allograft bone graft on 01/22/2016 with Dr. Otelia Sergeant.   Cardiologist is Dr. Holley Raring, who has cleared pt for surgery. PCP is Dr. Deforest Hoyles, who has cleared pt for surgery. Notes for both physicians can be found in care everywhere.   PMH includes:  HTN, hart murmur, DM, OSA (on CPAP), COPD, hepatitis, seizures (as a child), post-op N/V. Former smoker. BMI 44.5. S/p TLIF 05/20/12.  Medications include: humulin R, hctz, invokana, lisinopril.  Preoperative labs reviewed.  HgbA1c 8.2, glucose 145.   EKG tracing requested from [facility]. EKG report from care everywhere notes EKG dated 09/26/15 showed NSR, cannot rule out anterior infarct, age undetermined.   Echo 11/01/15:  1. Study technically difficult due to body habitus.  2. LV cavity is normal in size although not well visualized. Mild concentric LVH. Normal global wall motion. EF 61% 3. LA mildly dilated 4. Structurally normal MV with trace regurgitation  Stress echo 02/19/11:  - Technically difficult. Endocardium poorly visualized on apical views.  - Poor exercise capacity - Probably normal stress echo  If no changes, I anticipate pt can proceed with surgery as scheduled.   Rica Mast, FNP-BC [facility]/Anesthesiology Phone: [phone] 01/18/2016 12:04 PM

## 2016-01-21 MED ORDER — DEXTROSE 5 % IV SOLN
3.0000 g | INTRAVENOUS | Status: AC
  Administered 2016-01-22 (×2): 3 g via INTRAVENOUS
  Filled 2016-01-21: qty 3000

## 2016-01-22 ENCOUNTER — Inpatient Hospital Stay (HOSPITAL_COMMUNITY): Payer: Managed Care, Other (non HMO) | Admitting: Emergency Medicine

## 2016-01-22 ENCOUNTER — Inpatient Hospital Stay (HOSPITAL_COMMUNITY): Payer: Managed Care, Other (non HMO) | Admitting: Certified Registered Nurse Anesthetist

## 2016-01-22 ENCOUNTER — Inpatient Hospital Stay (HOSPITAL_COMMUNITY)
Admission: RE | Admit: 2016-01-22 | Discharge: 2016-01-25 | DRG: 460 | Disposition: A | Discharge status: Home Health | Payer: Managed Care, Other (non HMO) | Source: Ambulatory Visit | Attending: Specialist | Admitting: Specialist

## 2016-01-22 ENCOUNTER — Encounter (HOSPITAL_COMMUNITY)
Admission: RE | Disposition: A | Discharge status: Home Health | Payer: Self-pay | Source: Ambulatory Visit | Attending: Specialist

## 2016-01-22 ENCOUNTER — Encounter (HOSPITAL_COMMUNITY): Payer: Self-pay | Admitting: Certified Registered Nurse Anesthetist

## 2016-01-22 ENCOUNTER — Inpatient Hospital Stay (HOSPITAL_COMMUNITY): Payer: Managed Care, Other (non HMO)

## 2016-01-22 DIAGNOSIS — E118 Type 2 diabetes mellitus with unspecified complications: Secondary | ICD-10-CM | POA: Insufficient documentation

## 2016-01-22 DIAGNOSIS — E2740 Unspecified adrenocortical insufficiency: Secondary | ICD-10-CM | POA: Diagnosis not present

## 2016-01-22 DIAGNOSIS — G4733 Obstructive sleep apnea (adult) (pediatric): Secondary | ICD-10-CM | POA: Diagnosis present

## 2016-01-22 DIAGNOSIS — Y838 Other surgical procedures as the cause of abnormal reaction of the patient, or of later complication, without mention of misadventure at the time of the procedure: Secondary | ICD-10-CM | POA: Diagnosis present

## 2016-01-22 DIAGNOSIS — M7138 Other bursal cyst, other site: Secondary | ICD-10-CM | POA: Diagnosis present

## 2016-01-22 DIAGNOSIS — Z888 Allergy status to other drugs, medicaments and biological substances status: Secondary | ICD-10-CM

## 2016-01-22 DIAGNOSIS — D649 Anemia, unspecified: Secondary | ICD-10-CM | POA: Diagnosis not present

## 2016-01-22 DIAGNOSIS — Y92230 Patient room in hospital as the place of occurrence of the external cause: Secondary | ICD-10-CM | POA: Diagnosis not present

## 2016-01-22 DIAGNOSIS — J449 Chronic obstructive pulmonary disease, unspecified: Secondary | ICD-10-CM | POA: Diagnosis present

## 2016-01-22 DIAGNOSIS — M5136 Other intervertebral disc degeneration, lumbar region: Secondary | ICD-10-CM | POA: Diagnosis present

## 2016-01-22 DIAGNOSIS — T8484XA Pain due to internal orthopedic prosthetic devices, implants and grafts, initial encounter: Secondary | ICD-10-CM | POA: Diagnosis present

## 2016-01-22 DIAGNOSIS — M960 Pseudarthrosis after fusion or arthrodesis: Secondary | ICD-10-CM | POA: Diagnosis present

## 2016-01-22 DIAGNOSIS — M545 Low back pain: Secondary | ICD-10-CM | POA: Diagnosis present

## 2016-01-22 DIAGNOSIS — I950 Idiopathic hypotension: Secondary | ICD-10-CM | POA: Diagnosis not present

## 2016-01-22 DIAGNOSIS — S0990XA Unspecified injury of head, initial encounter: Secondary | ICD-10-CM

## 2016-01-22 DIAGNOSIS — I10 Essential (primary) hypertension: Secondary | ICD-10-CM | POA: Diagnosis present

## 2016-01-22 DIAGNOSIS — Z23 Encounter for immunization: Secondary | ICD-10-CM | POA: Diagnosis not present

## 2016-01-22 DIAGNOSIS — S0093XA Contusion of unspecified part of head, initial encounter: Secondary | ICD-10-CM | POA: Diagnosis not present

## 2016-01-22 DIAGNOSIS — Z87891 Personal history of nicotine dependence: Secondary | ICD-10-CM | POA: Diagnosis not present

## 2016-01-22 DIAGNOSIS — M4806 Spinal stenosis, lumbar region: Secondary | ICD-10-CM | POA: Diagnosis present

## 2016-01-22 DIAGNOSIS — T84038A Mechanical loosening of other internal prosthetic joint, initial encounter: Principal | ICD-10-CM | POA: Diagnosis present

## 2016-01-22 DIAGNOSIS — W19XXXA Unspecified fall, initial encounter: Secondary | ICD-10-CM

## 2016-01-22 DIAGNOSIS — W1830XA Fall on same level, unspecified, initial encounter: Secondary | ICD-10-CM | POA: Diagnosis not present

## 2016-01-22 DIAGNOSIS — E875 Hyperkalemia: Secondary | ICD-10-CM | POA: Diagnosis present

## 2016-01-22 DIAGNOSIS — E1165 Type 2 diabetes mellitus with hyperglycemia: Secondary | ICD-10-CM | POA: Diagnosis present

## 2016-01-22 DIAGNOSIS — Z794 Long term (current) use of insulin: Secondary | ICD-10-CM

## 2016-01-22 DIAGNOSIS — T465X5A Adverse effect of other antihypertensive drugs, initial encounter: Secondary | ICD-10-CM | POA: Diagnosis not present

## 2016-01-22 DIAGNOSIS — F329 Major depressive disorder, single episode, unspecified: Secondary | ICD-10-CM | POA: Diagnosis present

## 2016-01-22 DIAGNOSIS — M48062 Spinal stenosis, lumbar region with neurogenic claudication: Secondary | ICD-10-CM | POA: Diagnosis present

## 2016-01-22 DIAGNOSIS — Z419 Encounter for procedure for purposes other than remedying health state, unspecified: Secondary | ICD-10-CM

## 2016-01-22 LAB — GLUCOSE, CAPILLARY
GLUCOSE-CAPILLARY: 121 mg/dL — AB (ref 65–99)
GLUCOSE-CAPILLARY: 226 mg/dL — AB (ref 65–99)
GLUCOSE-CAPILLARY: 335 mg/dL — AB (ref 65–99)
GLUCOSE-CAPILLARY: 296 mg/dL — AB (ref 65–99)
Glucose-Capillary: 278 mg/dL — ABNORMAL HIGH (ref 65–99)

## 2016-01-22 SURGERY — POSTERIOR LUMBAR FUSION 1 WITH HARDWARE REMOVAL
Anesthesia: General

## 2016-01-22 MED ORDER — PROPOFOL 10 MG/ML IV BOLUS
INTRAVENOUS | Status: AC
  Filled 2016-01-22: qty 20

## 2016-01-22 MED ORDER — KETOROLAC TROMETHAMINE 30 MG/ML IJ SOLN
INTRAMUSCULAR | Status: AC
  Filled 2016-01-22: qty 1

## 2016-01-22 MED ORDER — PANTOPRAZOLE SODIUM 40 MG IV SOLR
40.0000 mg | Freq: Every day | INTRAVENOUS | Status: DC
  Administered 2016-01-22: 40 mg via INTRAVENOUS
  Filled 2016-01-22: qty 40

## 2016-01-22 MED ORDER — SODIUM CHLORIDE 0.9% FLUSH
3.0000 mL | Freq: Two times a day (BID) | INTRAVENOUS | Status: DC
  Administered 2016-01-22 – 2016-01-24 (×5): 3 mL via INTRAVENOUS

## 2016-01-22 MED ORDER — 0.9 % SODIUM CHLORIDE (POUR BTL) OPTIME
TOPICAL | Status: DC | PRN
  Administered 2016-01-22 (×2): 1000 mL

## 2016-01-22 MED ORDER — BUPIVACAINE HCL 0.5 % IJ SOLN
INTRAMUSCULAR | Status: DC | PRN
  Administered 2016-01-22: 30 mL

## 2016-01-22 MED ORDER — MENTHOL 3 MG MT LOZG: 1.0000 | LOZENGE | OROMUCOSAL | Status: DC | PRN

## 2016-01-22 MED ORDER — FENTANYL CITRATE (PF) 100 MCG/2ML IJ SOLN
INTRAMUSCULAR | Status: DC | PRN
  Administered 2016-01-22 (×8): 50 ug via INTRAVENOUS

## 2016-01-22 MED ORDER — HYDROCODONE-ACETAMINOPHEN 5-325 MG PO TABS
1.0000 | ORAL_TABLET | ORAL | Status: DC | PRN
  Administered 2016-01-23: 2 via ORAL
  Filled 2016-01-22: qty 2

## 2016-01-22 MED ORDER — POTASSIUM CHLORIDE IN NACL 20-0.9 MEQ/L-% IV SOLN
INTRAVENOUS | Status: DC
  Administered 2016-01-22: 17:00:00 via INTRAVENOUS
  Filled 2016-01-22: qty 1000

## 2016-01-22 MED ORDER — MIDAZOLAM HCL 5 MG/5ML IJ SOLN
INTRAMUSCULAR | Status: DC | PRN
  Administered 2016-01-22: 2 mg via INTRAVENOUS

## 2016-01-22 MED ORDER — FENTANYL CITRATE (PF) 100 MCG/2ML IJ SOLN
25.0000 ug | INTRAMUSCULAR | Status: DC | PRN
  Administered 2016-01-22: 50 ug via INTRAVENOUS
  Administered 2016-01-22 (×2): 25 ug via INTRAVENOUS

## 2016-01-22 MED ORDER — ACETAMINOPHEN 650 MG RE SUPP: 650.0000 mg | RECTAL | Status: DC | PRN

## 2016-01-22 MED ORDER — PROPOFOL 500 MG/50ML IV EMUL
INTRAVENOUS | Status: DC | PRN
  Administered 2016-01-22: 20 ug/kg/min via INTRAVENOUS

## 2016-01-22 MED ORDER — GLYCOPYRROLATE 0.2 MG/ML IJ SOLN
INTRAMUSCULAR | Status: DC | PRN
Start: 2016-01-22 — End: 2016-01-22
  Administered 2016-01-22: .8 mg via INTRAVENOUS

## 2016-01-22 MED ORDER — INSULIN ASPART 100 UNIT/ML ~~LOC~~ SOLN
0.0000 [IU] | Freq: Three times a day (TID) | SUBCUTANEOUS | Status: DC
  Administered 2016-01-22: 15 [IU] via SUBCUTANEOUS
  Administered 2016-01-23: 4 [IU] via SUBCUTANEOUS
  Administered 2016-01-23 (×2): 7 [IU] via SUBCUTANEOUS
  Administered 2016-01-24 – 2016-01-25 (×5): 4 [IU] via SUBCUTANEOUS

## 2016-01-22 MED ORDER — CLOTRIMAZOLE 1 % EX CREA
TOPICAL_CREAM | Freq: Two times a day (BID) | CUTANEOUS | Status: DC
  Administered 2016-01-22: 21:00:00 via TOPICAL
  Filled 2016-01-22: qty 15

## 2016-01-22 MED ORDER — SERTRALINE HCL 50 MG PO TABS
50.0000 mg | ORAL_TABLET | Freq: Every day | ORAL | Status: DC
  Administered 2016-01-22 – 2016-01-24 (×3): 50 mg via ORAL
  Filled 2016-01-22 (×3): qty 1

## 2016-01-22 MED ORDER — EPHEDRINE SULFATE 50 MG/ML IJ SOLN
INTRAMUSCULAR | Status: DC | PRN
  Administered 2016-01-22: 5 mg via INTRAVENOUS
  Administered 2016-01-22: 10 mg via INTRAVENOUS
  Administered 2016-01-22 (×2): 5 mg via INTRAVENOUS

## 2016-01-22 MED ORDER — INFLUENZA VAC SPLIT QUAD 0.5 ML IM SUSY
0.5000 mL | PREFILLED_SYRINGE | INTRAMUSCULAR | Status: AC
  Administered 2016-01-23: 0.5 mL via INTRAMUSCULAR
  Filled 2016-01-22: qty 0.5

## 2016-01-22 MED ORDER — LIDOCAINE HCL (CARDIAC) 20 MG/ML IV SOLN
INTRAVENOUS | Status: DC | PRN
  Administered 2016-01-22: 100 mg via INTRAVENOUS

## 2016-01-22 MED ORDER — OXYCODONE-ACETAMINOPHEN 5-325 MG PO TABS
1.0000 | ORAL_TABLET | ORAL | Status: DC | PRN
  Administered 2016-01-22 – 2016-01-25 (×12): 2 via ORAL
  Filled 2016-01-22 (×12): qty 2

## 2016-01-22 MED ORDER — INSULIN ASPART 100 UNIT/ML ~~LOC~~ SOLN
SUBCUTANEOUS | Status: AC
  Filled 2016-01-22: qty 10

## 2016-01-22 MED ORDER — MORPHINE SULFATE (PF) 2 MG/ML IV SOLN
1.0000 mg | INTRAVENOUS | Status: DC | PRN
  Administered 2016-01-22: 2 mg via INTRAVENOUS
  Administered 2016-01-22: 4 mg via INTRAVENOUS
  Administered 2016-01-22: 2 mg via INTRAVENOUS
  Administered 2016-01-23 (×2): 4 mg via INTRAVENOUS
  Filled 2016-01-22: qty 2
  Filled 2016-01-22: qty 1
  Filled 2016-01-22: qty 2
  Filled 2016-01-22: qty 1
  Filled 2016-01-22: qty 2

## 2016-01-22 MED ORDER — DEXAMETHASONE SODIUM PHOSPHATE 4 MG/ML IJ SOLN
INTRAMUSCULAR | Status: DC | PRN
  Administered 2016-01-22: 4 mg via INTRAVENOUS

## 2016-01-22 MED ORDER — ROCURONIUM BROMIDE 100 MG/10ML IV SOLN
INTRAVENOUS | Status: DC | PRN
  Administered 2016-01-22: 20 mg via INTRAVENOUS
  Administered 2016-01-22: 10 mg via INTRAVENOUS
  Administered 2016-01-22: 50 mg via INTRAVENOUS
  Administered 2016-01-22: 10 mg via INTRAVENOUS
  Administered 2016-01-22: 20 mg via INTRAVENOUS
  Administered 2016-01-22 (×3): 10 mg via INTRAVENOUS

## 2016-01-22 MED ORDER — BUPIVACAINE LIPOSOME 1.3 % IJ SUSP
20.0000 mL | INTRAMUSCULAR | Status: DC
  Filled 2016-01-22: qty 20

## 2016-01-22 MED ORDER — SODIUM CHLORIDE 0.9 % IV SOLN: 250.0000 mL | INTRAVENOUS | Status: DC

## 2016-01-22 MED ORDER — MIDAZOLAM HCL 2 MG/2ML IJ SOLN
INTRAMUSCULAR | Status: AC
  Filled 2016-01-22: qty 2

## 2016-01-22 MED ORDER — PHENOL 1.4 % MT LIQD: 1.0000 | OROMUCOSAL | Status: DC | PRN

## 2016-01-22 MED ORDER — ZOLPIDEM TARTRATE 5 MG PO TABS: 5.0000 mg | ORAL_TABLET | Freq: Every evening | ORAL | Status: DC | PRN

## 2016-01-22 MED ORDER — INSULIN ASPART 100 UNIT/ML ~~LOC~~ SOLN
10.0000 [IU] | Freq: Once | SUBCUTANEOUS | Status: AC
  Administered 2016-01-22: 10 [IU] via INTRAVENOUS

## 2016-01-22 MED ORDER — VITAMIN D 1000 UNITS PO TABS: 1000.0000 [IU] | ORAL_TABLET | Freq: Every day | ORAL | Status: DC

## 2016-01-22 MED ORDER — ONDANSETRON HCL 4 MG/2ML IJ SOLN: 4.0000 mg | INTRAMUSCULAR | Status: DC | PRN

## 2016-01-22 MED ORDER — ALBUMIN HUMAN 5 % IV SOLN
INTRAVENOUS | Status: DC | PRN
  Administered 2016-01-22: 09:00:00 via INTRAVENOUS

## 2016-01-22 MED ORDER — DOCUSATE SODIUM 100 MG PO CAPS
100.0000 mg | ORAL_CAPSULE | Freq: Two times a day (BID) | ORAL | Status: DC
Start: 2016-01-22 — End: 2016-01-25
  Administered 2016-01-22 – 2016-01-25 (×6): 100 mg via ORAL
  Filled 2016-01-22 (×6): qty 1

## 2016-01-22 MED ORDER — INSULIN ASPART 100 UNIT/ML ~~LOC~~ SOLN
4.0000 [IU] | Freq: Three times a day (TID) | SUBCUTANEOUS | Status: DC
  Administered 2016-01-22 – 2016-01-25 (×9): 4 [IU] via SUBCUTANEOUS

## 2016-01-22 MED ORDER — BUPIVACAINE HCL (PF) 0.5 % IJ SOLN
INTRAMUSCULAR | Status: AC
  Filled 2016-01-22: qty 30

## 2016-01-22 MED ORDER — KETOROLAC TROMETHAMINE 30 MG/ML IJ SOLN
30.0000 mg | Freq: Once | INTRAMUSCULAR | Status: AC
  Administered 2016-01-22: 30 mg via INTRAVENOUS

## 2016-01-22 MED ORDER — CHLORHEXIDINE GLUCONATE 4 % EX LIQD: 60.0000 mL | Freq: Once | CUTANEOUS | Status: DC

## 2016-01-22 MED ORDER — BUPIVACAINE LIPOSOME 1.3 % IJ SUSP
INTRAMUSCULAR | Status: DC | PRN
  Administered 2016-01-22: 20 mL

## 2016-01-22 MED ORDER — BISACODYL 5 MG PO TBEC: 5.0000 mg | DELAYED_RELEASE_TABLET | Freq: Every day | ORAL | Status: DC | PRN

## 2016-01-22 MED ORDER — LISINOPRIL 5 MG PO TABS: 5.0000 mg | ORAL_TABLET | Freq: Every day | ORAL | Status: DC

## 2016-01-22 MED ORDER — METHOCARBAMOL 1000 MG/10ML IJ SOLN
500.0000 mg | Freq: Four times a day (QID) | INTRAMUSCULAR | Status: DC | PRN
  Filled 2016-01-22: qty 5

## 2016-01-22 MED ORDER — ACETAMINOPHEN 325 MG PO TABS: 650.0000 mg | ORAL_TABLET | ORAL | Status: DC | PRN

## 2016-01-22 MED ORDER — PHENYLEPHRINE HCL 10 MG/ML IJ SOLN
INTRAMUSCULAR | Status: DC | PRN
  Administered 2016-01-22 (×2): 80 ug via INTRAVENOUS

## 2016-01-22 MED ORDER — SCOPOLAMINE 1 MG/3DAYS TD PT72
1.0000 | MEDICATED_PATCH | Freq: Once | TRANSDERMAL | Status: AC
  Administered 2016-01-22: 1.5 mg via TRANSDERMAL
  Filled 2016-01-22 (×2): qty 1

## 2016-01-22 MED ORDER — THROMBIN 20000 UNITS EX SOLR
CUTANEOUS | Status: AC
  Filled 2016-01-22: qty 20000

## 2016-01-22 MED ORDER — FENTANYL CITRATE (PF) 100 MCG/2ML IJ SOLN
INTRAMUSCULAR | Status: AC
  Filled 2016-01-22: qty 2

## 2016-01-22 MED ORDER — CEFAZOLIN SODIUM-DEXTROSE 2-3 GM-% IV SOLR
INTRAVENOUS | Status: AC
  Filled 2016-01-22: qty 100

## 2016-01-22 MED ORDER — HYDROCHLOROTHIAZIDE 25 MG PO TABS: 25.0000 mg | ORAL_TABLET | Freq: Every day | ORAL | Status: DC

## 2016-01-22 MED ORDER — PROPOFOL 10 MG/ML IV BOLUS
INTRAVENOUS | Status: DC | PRN
  Administered 2016-01-22: 150 mg via INTRAVENOUS

## 2016-01-22 MED ORDER — METHOCARBAMOL 500 MG PO TABS
500.0000 mg | ORAL_TABLET | Freq: Four times a day (QID) | ORAL | Status: DC | PRN
  Administered 2016-01-24 – 2016-01-25 (×3): 500 mg via ORAL
  Filled 2016-01-22 (×3): qty 1

## 2016-01-22 MED ORDER — SODIUM CHLORIDE 0.9% FLUSH: 3.0000 mL | INTRAVENOUS | Status: DC | PRN

## 2016-01-22 MED ORDER — PROMETHAZINE HCL 25 MG/ML IJ SOLN: 6.2500 mg | INTRAMUSCULAR | Status: DC | PRN

## 2016-01-22 MED ORDER — THROMBIN 20000 UNITS EX KIT
PACK | CUTANEOUS | Status: DC | PRN
  Administered 2016-01-22: 20000 [IU] via TOPICAL

## 2016-01-22 MED ORDER — CEFAZOLIN SODIUM 1-5 GM-% IV SOLN
1.0000 g | Freq: Three times a day (TID) | INTRAVENOUS | Status: AC
  Administered 2016-01-22 – 2016-01-23 (×2): 1 g via INTRAVENOUS
  Filled 2016-01-22 (×3): qty 50

## 2016-01-22 MED ORDER — ONDANSETRON HCL 4 MG/2ML IJ SOLN
INTRAMUSCULAR | Status: DC | PRN
  Administered 2016-01-22: 4 mg via INTRAVENOUS

## 2016-01-22 MED ORDER — FLEET ENEMA 7-19 GM/118ML RE ENEM: 1.0000 | ENEMA | Freq: Once | RECTAL | Status: DC | PRN

## 2016-01-22 MED ORDER — SENNOSIDES-DOCUSATE SODIUM 8.6-50 MG PO TABS: 1.0000 | ORAL_TABLET | Freq: Every evening | ORAL | Status: DC | PRN

## 2016-01-22 MED ORDER — FENTANYL CITRATE (PF) 250 MCG/5ML IJ SOLN
INTRAMUSCULAR | Status: AC
  Filled 2016-01-22: qty 5

## 2016-01-22 MED ORDER — PHENYLEPHRINE HCL 10 MG/ML IJ SOLN
10.0000 mg | INTRAVENOUS | Status: DC | PRN
  Administered 2016-01-22: 20 ug/min via INTRAVENOUS

## 2016-01-22 MED ORDER — LACTATED RINGERS IV SOLN
INTRAVENOUS | Status: DC | PRN
  Administered 2016-01-22 (×2): via INTRAVENOUS

## 2016-01-22 MED ORDER — NEOSTIGMINE METHYLSULFATE 10 MG/10ML IV SOLN
INTRAVENOUS | Status: DC | PRN
  Administered 2016-01-22: 5 mg via INTRAVENOUS

## 2016-01-22 SURGICAL SUPPLY — 70 items
BLADE SURG ROTATE 9660 (MISCELLANEOUS) IMPLANT
BONE CANC CHIPS 20CC PCAN1/4 (Bone Implant) ×3 IMPLANT
BONE MATRIX VIVIGEN 5CC (Bone Implant) ×3 IMPLANT
BUR MATCHSTICK NEURO 3.0 LAGG (BURR) ×3 IMPLANT
BUR RND FLUTED 2.5 (BURR) IMPLANT
BUR SABER RD CUTTING 3.0 (BURR) ×2 IMPLANT
BUR SABER RD CUTTING 3.0MM (BURR) ×1
CAGE CONCORDE BULLET 9X11X23 (Cage) ×2 IMPLANT
CAGE SPNL 5D BLT NOSE 23X9X11 (Cage) ×1 IMPLANT
CHIPS CANC BONE 20CC PCAN1/4 (Bone Implant) ×1 IMPLANT
COVER MAYO STAND STRL (DRAPES) ×6 IMPLANT
COVER SURGICAL LIGHT HANDLE (MISCELLANEOUS) ×3 IMPLANT
DERMABOND ADVANCED (GAUZE/BANDAGES/DRESSINGS) ×2
DERMABOND ADVANCED .7 DNX12 (GAUZE/BANDAGES/DRESSINGS) ×1 IMPLANT
DRAPE C-ARM 42X72 X-RAY (DRAPES) ×6 IMPLANT
DRAPE MICROSCOPE LEICA (MISCELLANEOUS) ×3 IMPLANT
DRAPE SURG 17X23 STRL (DRAPES) ×12 IMPLANT
DRAPE TABLE COVER HEAVY DUTY (DRAPES) ×3 IMPLANT
DRSG MEPILEX BORDER 4X4 (GAUZE/BANDAGES/DRESSINGS) IMPLANT
DRSG MEPILEX BORDER 4X8 (GAUZE/BANDAGES/DRESSINGS) ×3 IMPLANT
DRSG PAD ABDOMINAL 8X10 ST (GAUZE/BANDAGES/DRESSINGS) ×3 IMPLANT
DURAPREP 26ML APPLICATOR (WOUND CARE) ×3 IMPLANT
ELECT BLADE 4.0 EZ CLEAN MEGAD (MISCELLANEOUS) ×3
ELECT BLADE 6.5 EXT (BLADE) ×3 IMPLANT
ELECT CAUTERY BLADE 6.4 (BLADE) ×3 IMPLANT
ELECT REM PT RETURN 9FT ADLT (ELECTROSURGICAL) ×3
ELECTRODE BLDE 4.0 EZ CLN MEGD (MISCELLANEOUS) ×1 IMPLANT
ELECTRODE REM PT RTRN 9FT ADLT (ELECTROSURGICAL) ×1 IMPLANT
EVACUATOR 1/8 PVC DRAIN (DRAIN) IMPLANT
GAUZE SPONGE 4X4 12PLY STRL (GAUZE/BANDAGES/DRESSINGS) IMPLANT
GLOVE BIOGEL PI IND STRL 7.0 (GLOVE) ×1 IMPLANT
GLOVE BIOGEL PI IND STRL 8 (GLOVE) ×1 IMPLANT
GLOVE BIOGEL PI INDICATOR 7.0 (GLOVE) ×2
GLOVE BIOGEL PI INDICATOR 8 (GLOVE) ×2
GLOVE ECLIPSE 9.0 STRL (GLOVE) ×3 IMPLANT
GLOVE ORTHO TXT STRL SZ7.5 (GLOVE) ×3 IMPLANT
GLOVE SURG 8.5 LATEX PF (GLOVE) ×3 IMPLANT
GOWN STRL REUS W/ TWL LRG LVL3 (GOWN DISPOSABLE) ×1 IMPLANT
GOWN STRL REUS W/TWL 2XL LVL3 (GOWN DISPOSABLE) ×6 IMPLANT
GOWN STRL REUS W/TWL LRG LVL3 (GOWN DISPOSABLE) ×2
HEMOSTAT SURGICEL 2X14 (HEMOSTASIS) IMPLANT
KIT BASIN OR (CUSTOM PROCEDURE TRAY) ×3 IMPLANT
KIT POSITION SURG JACKSON T1 (MISCELLANEOUS) ×3 IMPLANT
KIT ROOM TURNOVER OR (KITS) ×3 IMPLANT
NEEDLE 22X1 1/2 (OR ONLY) (NEEDLE) ×3 IMPLANT
NEEDLE ASP BONE MRW 11GX15 (NEEDLE) IMPLANT
NEEDLE BONE MARROW 8GAX6 (NEEDLE) IMPLANT
NS IRRIG 1000ML POUR BTL (IV SOLUTION) ×6 IMPLANT
PACK LAMINECTOMY ORTHO (CUSTOM PROCEDURE TRAY) ×3 IMPLANT
PAD ARMBOARD 7.5X6 YLW CONV (MISCELLANEOUS) ×6 IMPLANT
PATTIES SURGICAL .75X.75 (GAUZE/BANDAGES/DRESSINGS) ×3 IMPLANT
ROD PRE BENT EXP 40MM (Rod) ×6 IMPLANT
SCREW POLY EXPEDIUM 8.0X45MM (Screw) ×12 IMPLANT
SCREW SET SINGLE INNER (Screw) ×12 IMPLANT
SPONGE LAP 4X18 X RAY DECT (DISPOSABLE) ×9 IMPLANT
SPONGE SURGIFOAM ABS GEL SZ50 (HEMOSTASIS) ×3 IMPLANT
SUT VIC AB 0 CT1 27 (SUTURE) ×2
SUT VIC AB 0 CT1 27XBRD ANBCTR (SUTURE) ×1 IMPLANT
SUT VIC AB 1 CTX 36 (SUTURE) ×6
SUT VIC AB 1 CTX36XBRD ANBCTR (SUTURE) ×3 IMPLANT
SUT VIC AB 2-0 CT1 27 (SUTURE) ×2
SUT VIC AB 2-0 CT1 TAPERPNT 27 (SUTURE) ×1 IMPLANT
SUT VIC AB 3-0 X1 27 (SUTURE) ×3 IMPLANT
SYR 20CC LL (SYRINGE) ×3 IMPLANT
SYR CONTROL 10ML LL (SYRINGE) ×6 IMPLANT
TOWEL OR 17X24 6PK STRL BLUE (TOWEL DISPOSABLE) ×3 IMPLANT
TOWEL OR 17X26 10 PK STRL BLUE (TOWEL DISPOSABLE) ×3 IMPLANT
TRAY FOLEY CATH 16FRSI W/METER (SET/KITS/TRAYS/PACK) ×3 IMPLANT
WATER STERILE IRR 1000ML POUR (IV SOLUTION) ×3 IMPLANT
YANKAUER SUCT BULB TIP NO VENT (SUCTIONS) ×3 IMPLANT

## 2016-01-22 NOTE — Discharge Instructions (Addendum)
° ° °  Call if there is increasing drainage, fever greater than 101.5, severe head aches, and worsening nausea or light sensitivity. If shortness of breath, bloody cough or chest tightness or pain go to an emergency room. No lifting greater than 10 lbs. Avoid bending, stooping and twisting. Use brace when sitting and out of bed even to go to bathroom. Walk in house for first 2 weeks then may start to get out slowly increasing distances up to one quarter mile by 4-6 weeks post op. After 5 days may shower and change dressing following bathing with shower.When bathing remove the brace shower and replace brace before getting out of the shower. If drainage, keep dry dressing and do not bathe the incision, use an moisture impervious dressing. Please call and return for scheduled follow up appointment 21/2 weeks from the time of surgery.  If your blood pressure is low, systolic (upper number) less than 115 then do not take your high blood pressure medications. Please call and see your primary care physician in the next 1-2 weeks for evaluation of your antihypertensive agents and your diabetes. May resume your oral antidiabetes (oral antihyperglycemics, invokana) Saturday morning.

## 2016-01-22 NOTE — Progress Notes (Signed)
Utilization review completed.  

## 2016-01-22 NOTE — Brief Op Note (Signed)
PATIENT ID:      Tiffany Hopkins  MRN:     [id number] DOB/AGE:    60-Nov-1957 / 61 y.o.       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    PROCEDURE:  Procedure(s): Removal of loosened hardware L4-5, right L4-5 foraminotomy, Redo transforaminal lumbar interbody fusion Left L4-5 with cage, left L3-4 resection of synovial cyst, Reinsertion of rods and screws, local and allograft bone graft    SURGEON: NITKA,JAMES E   ASSISTANT:  Zonia Kief, PA-C          ANESTHESIA:  General and Local infiltration with marcaine 0.5% 1:1 exparel 1.3% total 30cc, Dr. Desmond Lope, MD.     DRAINS:Foley to SD.  COMPLICATIONS:  None     EBL: 500cc  CELL SAVER RETURNED: 250  CONDITION:  Stable, condition extubated and returned to the recovery room.    NITKA,JAMES E 01/22/2016, 12:40 PM

## 2016-01-22 NOTE — Interval H&P Note (Signed)
The patient has been re-examined, and the chart reviewed, and there have been no interval changes to the documented history and physical.    The risks, benefits, and alternatives have been discussed at length, and the patient is willing to proceed.   

## 2016-01-22 NOTE — Transfer of Care (Signed)
Immediate Anesthesia Transfer of Care Note  Patient: Tiffany Hopkins  Procedure(s) Performed: Procedure(s): Removal of loosened hardware L4-5, right L4-5 foraminotomy, Redo transforaminal lumbar interbody fusion Left L4-5 with cage, left L3-4 resection of synovial cyst, Reinsertion of rods and screws, local and allograft bone graft (N/A)  Patient Location: PACU  Anesthesia Type:General  Level of Consciousness: awake, alert , oriented and patient cooperative  Airway & Oxygen Therapy: Patient Spontanous Breathing and Patient connected to face mask oxygen  Post-op Assessment: Report given to RN, Post -op Vital signs reviewed and stable, Patient moving all extremities and Patient moving all extremities X 4  Post vital signs: Reviewed and stable  Last Vitals:  Filed Vitals:   01/22/16 0652 01/22/16 1312  BP: 149/65 99/51  Pulse: 65 85  Temp: 36.8 C 36.6 C  Resp: 16 14    Complications: No apparent anesthesia complications

## 2016-01-22 NOTE — Progress Notes (Signed)
Dr Desmond Lope notified of CBG of 278 on arrival to PACU. Verbal order to give 10units novolog IV. Will administer insulin and recheck glucose/ continue to monitor.

## 2016-01-22 NOTE — Progress Notes (Signed)
Orthopedic Tech Progress Note Patient Details:  Tiffany Hopkins [birth year] [id number] Brace order completed by bio-tech vendor. Patient ID: Tiffany Hopkins, female   DOB: 1956-03-01, 60 y.o.   MRN: [id number]   Tiffany Hopkins 01/22/2016, 3:54 PM

## 2016-01-22 NOTE — Op Note (Signed)
01/22/2016  1:00 PM  PATIENT:  Tiffany Hopkins  60 y.o. 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     COMPONENTS:   Implant Name Type Inv. Item Serial No. Manufacturer Lot No. LRB No. Used  BONE CANC CHIPS 20CC - [id number] Bone Implant BONE [facility] CHIPS 20CC [id number] [employer] [place] [facility]   1  BONE MATRIX VIVIGEN 5CC - [id number] Bone Implant BONE MATRIX VIVIGEN 5CC [id number] [employer] [place] [employer]   1  SCREW POLY EXPEDIUM 8.0X45MM - [id number] Screw SCREW POLY EXPEDIUM 8.0X45MM  DEPUY SPINE   4  SCREW SET SINGLE INNER - [id number] Screw SCREW SET SINGLE INNER  DEPUY SPINE   4  ROD PRE BENT EXP 40MM - [id number] Rod ROD PRE BENT EXP 40MM  DEPUY SPINE   2  CAGE CONCORDE BULLET [id number] - [id number] Cage CAGE CONCORDE BULLET [id number]   DEPUY SPINE     1    PROCEDURE:The patient was met in the holding area, and the appropriate lumbar level Left L4-5 and L3-4  identified and marked with an x and my initials.The patient was then transported to OR. The patient was then placed under general anesthesia without difficulty.The patient received appropriate preoperative antibiotic prophylaxis ancef.  Nursing staff inserted a Foley catheter under  sterile conditions. She was then turned to a prone position Bradford spine table was used for this case. All pressure points were well padded PAS stocking applied bilateral lower extremity to prevent DVT. Standard prep DuraPrep solution. Draped in the usual manner. Time-out procedure was called and correct .  The old incision scar was ellipsed L4-5 and carried superiorly an additional 2 levels to the L2 spinous process.  Bovie electric cautery was used to control bleeding and carefully dissection was carried down along the lateral aspects of the spinous process of L3 to L5. Cobb then used to carefully elevate the paralumbar muscle and the incision in the midline was carried to to the level of the base of the residual spinous process of L4. The posterior exposure area extending from the base of the spinous process of L3 to the superior aspect of L5 was carried expose at its edges debrided the scar tissue using a large curette.The incision carried laterally exposing the bilateral L4-5 pedicle screws and rods. Osteotome was then used to resect the inferior articular process of left L4 removing with 3 mm Kerrison and Penfield 4 and pituitary rongeur. Carefully the medial aspect of the superior tip of process of L5 was freed up of scar tissue off of the adjacent thecal sac. Osteotome used to perform osteotomy of the superior articular process on the left side at L5 decompressing the lateral recess. This was done using three point technique and resected using a 3 mm Kerrison and curettes  residual bone remaining in the lateral recess was resected using 66m Kerrisons.  Loupe magnification and headlight were used for this portion of the procedure. Residual pars interarticularis overlying the left L4 neuroforamen was then resected using 2 and 3 mm Kerrisons. The left L4 nerve root well decompressed. Hockey-stick nerve probe was it will be passed out the left L4 neuroforamen and identifying the superior aspect of the L5  pedicle then osteotome used to resect the superior articular process of L5 transversely at the superior level of the L5 pedicle. This allowed for a wide decompression left L4 neuroforamen. Decompression was carried centrally the central portions of the residual neural arch of L4 using noise to protect the thecal sac and dividing the neural arch using 3 and 4 mm Kerrisons. Follow him at the L3-4 level was encountered and this was resected off the medial aspect of the left L34 facet. Speed burr was used to perform osteotomy of the medial aspect of the left L3-4 facet about 20%. Initially the ventral aspects of the spinous process were carefully thinned and debrided down to the ligamentum flavum and the inferior left lamina of L3 resected superiorly to the insertion site of ligamentum flavum. Following was then ventral aspect of the residual inferior aspect lamina of L3. It was debrided off of the base of the spinous process of L3 distally. The operating room microscope sterilely draped and brought into the field and under the operating room microscope then the left L3-4 level was decompressed to resecting ligamentum flavum centrally and off for medial left L3-4 facet debriding with this the small synovial cyst present on the patient's MRI scan. The debridement was complete a hockey-stick nerve probe could be passed out the left L3 neuroforamen. Osteotomes were then used to carefully resect further bone along the medial aspect of the left  L4 pedicle as well as over the medial left L4-5 facet. Foraminotomies performed over the left L5 nerve root and ligamentum flavum resected off the superior portion of the L5 lamina centrally into the left side. 3 mm Kerrison used to resect a portion of the right L5 lamina superiorly the foraminotomies over the right L5 nerve root. Residual neural arch resected to the right side and the right L3-4 lateral recess decompressed using 3 and 4 mm Kerrisons continued out the right L4  neuroforamen. Right L4 nerve root then well decompressed and the area of previous laminectomy on the side opposite of her expected TLIF. Hockey-stick nerve probe could be passed out the right L4 neuroforamen and of the right side L5 neuroforamen. L5 nerve root and lateral aspect of the thecal sac at the L4-5 level was then able to be mobilized with Penfield 4 and the disc space at the L4-5 level identified and the nerve structures retracted with Derricho retractor. 15 blade scalpel used to incise the disc the left side. L4-5 discectomy on the left side carried out with pituitary ronguers. Following debridement of degenerative disc material from the left side L4-5 disc, the disc space was dilated using 7 mm through 8 mm disc space dilators. A laminar spreader was inserted between the spinous process  base L4and the left L5 lamina excellent distraction of the disc space was obtained. The disc space was then prepared using straight curette and ring curettes. Degenerated disc as well as a cartilage endplates were debrided from the disc place using pituitary rongeurs,currettes. The disc space examined demonstrating good bleeding endplate bone surfaces. Bone graft that was harvested from the facet was  placed into the intervertebral disc space after first sounding the disc space for the correct cage size 11 mm. Cage chosen was a 10m Concorde lordotic cage. Local bone graft was then impacted into the intervertebral disc space at left L4-5 first placing within the disc space using a forceps then impacted with an 10 mm trial cage. After performing this  the permanent 11 mm Concorde lordotic cage packed with bone graft local and then Vivigen allograft was then inserted in the disc space on the left side to the left of the retained cage on the right side of the L4-5 disc space.. Careful inspection of the spinal canal demonstrated no surgery bone graft within the spinal canal both the L4 and L5 nerve roots appeared to exit  without further compression.  C-arm images were used to confirm the cage implant as well as positioning of the implant well within the intervertebral disc this cage was subset deep to the posterior aspect of the disc space by about 3-4 mm. Attention then turned to performance of insertion of pedicle screws the L4 level.   Exposure was then carried out lateral over the bilateral L3-4 facets exposing the L4 pedicle screws and then continued over the posterior lateral aspect of the spine exposing the rods and the pedicle screw fasteners at the L4-5 level. Fastener caps were then removed and scar freed up off of the hardware posterior laterally using Leksell rongeurs as well as curettes. Each of the screws were then removed the L4 levels found to have a 7 mm width by a 45 mm expediem screws and at the L5 levels 7 mm x 45 mm expediem screws,Attention then turned to performance of insertion of pedicle screws the L4 level. The residual transverse processes of both L4 and L4-5 identified and decorticated with high-speed bur. Cancellous bone chips and local bone graft and then again were placed into the old pedicle screw holes at both L4 and L5 filling these for insertion of new 8 mm screws. Careful assessment of the direction of the pedicle screw openings using a ball-tipped probe and then on the left side a 8 mm x 45 mm expediem pedicle screw was inserted. Similarly on the left side at the L5 level a 8 mm x 45 mm expediem pedicle screw was inserted. Additional bone graft was then placed between the transverse processes of L4 and L5 and a 40 mm precontoured 5 mm rod inserted into the pedicle screw fasteners and caps applied. Similarly and the right side revision screws were placed packing the old screw holes with cancellus chips decorticating the transverse processes and posterior lateral masses with a high-speed bur then placing cancellus bone graft and vivigen bone graft material and then placing the appropriate screws  8 mm x 45 mm screw at the L4 level and 8 mm x 45 mm screw at the L5 level. Additional bone graft and placed over the posterior lateral masses extending from the transverse process of L4 and L5 and then 45 mm precontoured 5 mm rod placed and Used to hold the rod into the fasteners on the right side. The fastener cap at the L4 level was then carefully tightened to 80 foot pounds and compression obtained between the fasteners of L4 and L5 and the fastener cap at the L5 level then tightened to 80 foot pounds. This was then repeated on the opposite left side. Careful inspection of the posterior aspect of the TLIF sites had demonstrated there was no bone within the spinal  canal over the posterior aspect of the disc space extending over the posterior aspect of the inferior L4 vertebral body or into the L4 neuroforamen bilaterally. Barbra Sarks was able be passed out each of the neuroforamen for the L4-L5 nerve roots and was straining patency and complete decompression of these nerve roots.  The C-arm was used to verify the position alignment of pedicle screws and rods.Irrigation was carried out using copious amounts of irrigant solution. Thrombin-soaked Gelfoam were placed for hemostasis was then carefully removed. Permanent C-arm images were obtained in AP oblique and lateral positions for documentation purposes. They showed the pedicle screws rods to be in good position alignment from L4-5  Cell Saver was used during this case however there 500 cc blood loss and a total of 250 cc of Cell Saver blood was returned.  The deep paralumbar muscles were approximated with 1 Vicryl sutures, the lumbodorsal fascia approximated with 0 and 1 Vicryl sutures. Subcutaneous layers were then approximated with interrupted 0 and 2-0 Vicryl sutures , skin closed with a running subcuticular 4-0 Vicryl suture. The bone was then applied. Mepilex bandage was then applied. All instrument and sponge counts were correct. Patient was then returned to  the supine position reactivated extubated and returned to the recovery room in satisfactory condition. Benjiman Core PA-C perform the duties of assistant surgeon during this case. He was present from the beginning of the case to the end of the case assisting in transfer the patient from his stretcher to the OR table and back to the stretcher at the end of the case. Assisted in careful retraction and suction of the laminectomy site delicate neural structures operating under the operating room microscope. He performed closure of the incision from the fascia to the skin applying the dressing.     NITKA,JAMES E 01/22/2016, 1:00 PM

## 2016-01-22 NOTE — Anesthesia Procedure Notes (Signed)
Procedure Name: Intubation Date/Time: 01/22/2016 7:47 AM Performed by: Adonis Housekeeper Pre-anesthesia Checklist: Patient identified, Emergency Drugs available, Suction available, Patient being monitored and Timeout performed Patient Re-evaluated:Patient Re-evaluated prior to inductionOxygen Delivery Method: Circle system utilized Preoxygenation: Pre-oxygenation with 100% oxygen Intubation Type: IV induction Ventilation: Two handed mask ventilation required Laryngoscope Size: Miller and 2 Grade View: Grade I Tube type: Oral Tube size: 7.5 mm Number of attempts: 1 Airway Equipment and Method: Patient positioned with wedge pillow and Stylet Placement Confirmation: ETT inserted through vocal cords under direct vision,  positive ETCO2,  CO2 detector and breath sounds checked- equal and bilateral Secured at: 21 cm Tube secured with: Tape Dental Injury: Teeth and Oropharynx as per pre-operative assessment

## 2016-01-22 NOTE — Progress Notes (Signed)
Orthopedic Tech Progress Note Patient DetailsHAZELENE DOTENTurner 03/26/1956 [id number]      Saul Fordyce 01/22/2016, 1:23 PMCalled [employer] for Lumbar corsett.

## 2016-01-22 NOTE — Anesthesia Preprocedure Evaluation (Addendum)
Anesthesia Evaluation  Patient identified by MRN, date of birth, ID band Patient awake  General Assessment Comment:HTN, heart murmur, DM, OSA (on CPAP), COPD, hepatitis, seizures (as a child), post-op N/V. Former smoker. BMI 44.5. S/p TLIF 05/20/12.  Reviewed: Allergy & Precautions, NPO status , Patient's Chart, lab work & pertinent test results  Airway Mallampati: II  TM Distance: >3 FB Neck ROM: Full    Dental  (+) Teeth Intact, Dental Advisory Given   Pulmonary sleep apnea and Continuous Positive Airway Pressure Ventilation , COPD, former smoker,    Pulmonary exam normal breath sounds clear to auscultation       Cardiovascular hypertension, Pt. on medications (-) angina(-) CAD, (-) Past MI and (-) CHF Normal cardiovascular exam Rhythm:Regular Rate:Normal  Echo 11/01/15:  1. Study technically difficult due to body habitus.  2. LV cavity is normal in size although not well visualized. Mild concentric LVH. Normal global wall motion. EF 61% 3. LA mildly dilated 4. Structurally normal MV with trace regurgitation  Stress echo 02/19/11:  - Technically difficult. Endocardium poorly visualized on apical views.  - Poor exercise capacity - Probably normal stress echo   Neuro/Psych Seizures - (childhood epilepsy),  PSYCHIATRIC DISORDERS Anxiety Depression    GI/Hepatic negative GI ROS, Neg liver ROS,   Endo/Other  negative endocrine ROSdiabetes, Type 2, Insulin Dependent, Oral Hypoglycemic AgentsMorbid obesity  Renal/GU negative Renal ROS     Musculoskeletal negative musculoskeletal ROS (+) Arthritis , Osteoarthritis,    Abdominal   Peds  Hematology negative hematology ROS (+)   Anesthesia Other Findings Day of surgery medications reviewed with the patient.  Reproductive/Obstetrics                           Anesthesia Physical Anesthesia Plan  ASA: III  Anesthesia Plan: General   Post-op Pain  Management:    Induction: Intravenous  Airway Management Planned: Oral ETT  Additional Equipment:   Intra-op Plan:   Post-operative Plan: Extubation in OR  Informed Consent: I have reviewed the patients History and Physical, chart, labs and discussed the procedure including the risks, benefits and alternatives for the proposed anesthesia with the patient or authorized representative who has indicated his/her understanding and acceptance.   Dental advisory given  Plan Discussed with: CRNA  Anesthesia Plan Comments: (Risks/benefits of general anesthesia discussed with patient including risk of damage to teeth, lips, gum, and tongue, nausea/vomiting, allergic reactions to medications, and the possibility of heart attack, stroke and death.  All patient questions answered.  Patient wishes to proceed.)        Anesthesia Quick Evaluation

## 2016-01-22 NOTE — H&P (Signed)
Tiffany Hopkins is an 60 y.o. female.    CHIEF COMPLAINT:   Low back pain.   HISTORY OF PRESENT ILLNESS:  The patient a 60 year old female returns today for followup of her back.  She is having increasing discomfort with her back.  Her low back pain is simply just not any better.  Most all of her pain is in her back left side greater than right.  No bowel or bladder symptoms.  She reports that she is having a lot of trouble standing and walking.  She uses a cart at the grocery store to ride due to pain that she has in her legs when she tries to stand and walk.  She has been scheduled for a revision of hardware at L4-5 for pseudoarthrosis of her fusion at this level and she also has loosening of the hardware, rods and screws, synovial cyst on the left side at L3, spinal stenosis at L3.  Our plan is to remove the synovial cyst with decompression here on the left side at L3-4, a hemilaminectomy, redo the TLIF at L4-5 on the left side revising the hardware at L4-5.  She has a history of diabetes and no doubt this does come into play also her study shows some residual foraminal stenosis right side at 4-5 so we would decompress that area as well.       Past Medical History  Diagnosis Date  . Anxiety   . Depression   . COPD (chronic obstructive pulmonary disease) (HCC)   . Pneumonia     hx.  . Diabetes mellitus   . Seizures (HCC)     as child epilopsy none as adult  . Arthritis   . Hepatitis   . PONV (postoperative nausea and vomiting)   . Heart murmur     no  cardiologist  . Hypertension   . Blood clot of vein in shoulder area     at age 2  . Sleep apnea   . OSA on CPAP     settings 5 to 15    Past Surgical History  Procedure Laterality Date  . Abdominal hysterectomy    . Cholecystectomy    . Fracture surgery      right hip fracture,pins  . Rotator cuff repair      right  . Bone spur    .  blood cloth removed      right shoulder  . Tendonititis      both hands  . Eye surgery   bleophoplasty bilateral  . Bladder suspension    . Breast lumpectomy Left 2016    at [facility]  . Back surgery    . Appendectomy    . Tubal ligation      History reviewed. No pertinent family history. Social History:  reports that she quit smoking about 5 years ago. Her smoking use included Cigarettes. She has a 30 pack-year smoking history. She has never used smokeless tobacco. She reports that she does not drink alcohol or use illicit drugs.  Allergies:  Allergies  Allergen Reactions  . Alprazolam Other (See Comments)    FATIGUE, MALAISE Lethargy  . Hydromorphone Hcl Itching and Rash    Makes skin feel like spiders crawling over her  . Glipizide Nausea And Vomiting     GI Upset  . Metformin Nausea And Vomiting    GI UPSET  . Pioglitazone Nausea And Vomiting    GI UPSET    Medications Prior to Admission  Medication Sig  Dispense Refill  . cholecalciferol (VITAMIN D) 1000 units tablet Take 1,000 Units by mouth daily.    . clotrimazole-betamethasone (LOTRISONE) cream Apply 1 application topically daily.  1  . HUMULIN R 500 UNIT/ML injection Inject 20 Units into the skin 3 (three) times daily.     . hydrochlorothiazide (HYDRODIURIL) 25 MG tablet Take 1 tablet by mouth daily as needed.     . INVOKANA 100 MG TABS tablet Take 100 mg by mouth daily.  0  . lisinopril (PRINIVIL,ZESTRIL) 5 MG tablet Take 5 mg by mouth daily.    . sertraline (ZOLOFT) 50 MG tablet Take 1 tablet by mouth at bedtime.   1    Results for orders placed or performed during the hospital encounter of 01/22/16 (from the past 48 hour(s))  Glucose, capillary     Status: Abnormal   Collection Time: 01/22/16  6:36 AM  Result Value Ref Range   Glucose-Capillary 121 (H) 65 - 99 mg/dL   Comment 1 Notify RN    Comment 2 Document in Chart    No results found.  Review of Systems  Constitutional: Negative.   HENT: Negative.   Eyes: Negative.   Respiratory: Negative.   Cardiovascular: Negative.    Gastrointestinal: Negative.   Genitourinary: Negative.   Musculoskeletal: Positive for back pain.  Skin: Negative.   Psychiatric/Behavioral: Negative.     Blood pressure 149/65, pulse 65, temperature 98.3 F (36.8 C), resp. rate 16, SpO2 97 %. Physical Exam  Constitutional: She is oriented to person, place, and time. No distress.  HENT:  Head: Atraumatic.  Eyes: EOM are normal.  Neck: Normal range of motion.  Cardiovascular: Normal rate.   Respiratory: No respiratory distress.  GI: She exhibits no distension.  Musculoskeletal: She exhibits tenderness.  Neurological: She is alert and oriented to person, place, and time.  Skin: Skin is warm and dry.  Psychiatric: She has a normal mood and affect.    PHYSICAL EXAMINATION:  Clinically her sciatic stretch signs are unremarkable.  Popliteal compression sign is negative.  Her biggest problems are neurogenic claudication which persists, pseudoarthrosis L4-5, a synovial cyst at L3-4 left side.    PLAN: Removal of loosened hardware L4-5, right L4-5 foraminotomy, Redo transforaminal lumbar interbody fusion Left L4-5 with cage, left L3-4 resection of synovial cyst, Reinsertion of rods and screws, local and allograft bone graft We will go ahead and schedule Marinda then to undergo intervention that was planned.  The risks of surgery including risk of infection, bleeding, neurologic compromise discussed with her.  We will go ahead and undertake the intervention when surgery is to be scheduled. Naida Sleight, PA-C 01/22/2016, 7:07 AM

## 2016-01-23 ENCOUNTER — Inpatient Hospital Stay (HOSPITAL_COMMUNITY): Payer: Managed Care, Other (non HMO)

## 2016-01-23 DIAGNOSIS — E118 Type 2 diabetes mellitus with unspecified complications: Secondary | ICD-10-CM | POA: Insufficient documentation

## 2016-01-23 DIAGNOSIS — E875 Hyperkalemia: Secondary | ICD-10-CM | POA: Insufficient documentation

## 2016-01-23 DIAGNOSIS — I950 Idiopathic hypotension: Secondary | ICD-10-CM | POA: Insufficient documentation

## 2016-01-23 LAB — BASIC METABOLIC PANEL
ANION GAP: 7 (ref 5–15)
BUN: 18 mg/dL (ref 6–20)
CALCIUM: 8.1 mg/dL — AB (ref 8.9–10.3)
CHLORIDE: 100 mmol/L — AB (ref 101–111)
CO2: 26 mmol/L (ref 22–32)
Creatinine, Ser: 1.05 mg/dL — ABNORMAL HIGH (ref 0.44–1.00)
GFR calc non Af Amer: 57 mL/min — ABNORMAL LOW (ref >60)
GLUCOSE: 244 mg/dL — AB (ref 65–99)
POTASSIUM: 5.2 mmol/L — AB (ref 3.5–5.1)
Sodium: 133 mmol/L — ABNORMAL LOW (ref 135–145)

## 2016-01-23 LAB — GLUCOSE, CAPILLARY
GLUCOSE-CAPILLARY: 216 mg/dL — AB (ref 65–99)
GLUCOSE-CAPILLARY: 200 mg/dL — AB (ref 65–99)
Glucose-Capillary: 207 mg/dL — ABNORMAL HIGH (ref 65–99)
Glucose-Capillary: 180 mg/dL — ABNORMAL HIGH (ref 65–99)

## 2016-01-23 LAB — CBC
HEMATOCRIT: 38.3 % (ref 36.0–46.0)
HEMOGLOBIN: 12.2 g/dL (ref 12.0–15.0)
MCH: 29.3 pg (ref 26.0–34.0)
MCHC: 31.9 g/dL (ref 30.0–36.0)
MCV: 91.8 fL (ref 78.0–100.0)
Platelets: 150 10*3/uL (ref 150–400)
RBC: 4.17 MIL/uL (ref 3.87–5.11)
RDW: 14.9 % (ref 11.5–15.5)
WBC: 9.8 10*3/uL (ref 4.0–10.5)

## 2016-01-23 LAB — HEMOGLOBIN A1C
Hgb A1c MFr Bld: 7.8 % — ABNORMAL HIGH (ref 4.8–5.6)
MEAN PLASMA GLUCOSE: 177 mg/dL

## 2016-01-23 MED ORDER — SODIUM CHLORIDE 0.9 % IV BOLUS (SEPSIS)
500.0000 mL | Freq: Once | INTRAVENOUS | Status: AC
  Administered 2016-01-23: 500 mL via INTRAVENOUS

## 2016-01-23 MED ORDER — SODIUM CHLORIDE 0.9 % IV SOLN
INTRAVENOUS | Status: DC
  Administered 2016-01-23 – 2016-01-25 (×4): via INTRAVENOUS

## 2016-01-23 MED ORDER — HYDROXYZINE HCL 25 MG PO TABS
50.0000 mg | ORAL_TABLET | Freq: Four times a day (QID) | ORAL | Status: DC | PRN
  Administered 2016-01-23 – 2016-01-25 (×4): 50 mg via ORAL
  Filled 2016-01-23 (×4): qty 2

## 2016-01-23 MED ORDER — PANTOPRAZOLE SODIUM 40 MG PO TBEC
40.0000 mg | DELAYED_RELEASE_TABLET | Freq: Every day | ORAL | Status: DC
  Administered 2016-01-23 – 2016-01-24 (×2): 40 mg via ORAL
  Filled 2016-01-23 (×2): qty 1

## 2016-01-23 MED ORDER — SODIUM CHLORIDE 0.9 % IV BOLUS (SEPSIS)
500.0000 mL | Freq: Once | INTRAVENOUS | Status: AC
  Administered 2016-01-24: 500 mL via INTRAVENOUS

## 2016-01-23 MED ORDER — DIPHENHYDRAMINE HCL 25 MG PO CAPS
50.0000 mg | ORAL_CAPSULE | Freq: Three times a day (TID) | ORAL | Status: DC | PRN
  Administered 2016-01-23: 50 mg via ORAL
  Filled 2016-01-23: qty 2

## 2016-01-23 MED ORDER — HYDRALAZINE HCL 20 MG/ML IJ SOLN: 5.0000 mg | INTRAMUSCULAR | Status: DC | PRN

## 2016-01-23 MED FILL — Sodium Chloride IV Soln 0.9%: INTRAVENOUS | Qty: 1000 | Status: AC

## 2016-01-23 MED FILL — Sodium Chloride Irrigation Soln 0.9%: Qty: 1000 | Status: AC

## 2016-01-23 MED FILL — Heparin Sodium (Porcine) Inj 1000 Unit/ML: INTRAMUSCULAR | Qty: 30 | Status: AC

## 2016-01-23 MED FILL — Thrombin For Soln 20000 Unit: CUTANEOUS | Qty: 1 | Status: AC

## 2016-01-23 NOTE — Progress Notes (Signed)
Inpatient Diabetes Program Recommendations  AACE/ADA: New Consensus Statement on Inpatient Glycemic Control (2015)  Target Ranges:  Prepandial:   less than 140 mg/dL      Peak postprandial:   less than 180 mg/dL (1-2 hours)      Critically ill patients:  140 - 180 mg/dL   Results for Tiffany Hopkins, Tiffany Hopkins (MRN [id number]) as of 01/23/2016 09:26  Ref. Range 01/22/2016 06:36 01/22/2016 13:13 01/22/2016 17:33 01/22/2016 21:29 01/23/2016 06:42  Glucose-Capillary Latest Ref Range: 65-99 mg/dL 409 (H) 811 (H) 914 (H) 296 (H) 216 (H)   Review of Glycemic Control  Diabetes history: DM 2 Outpatient Diabetes medications: Invokana 100 Daily, Humulin R 20 unit TID Current orders for Inpatient glycemic control: Novolog Resistant + Novolog 4 units TID [facility]  Inpatient Diabetes Program Recommendations: Insulin - Basal: Patient's glucose is consistently in the 200's. While inpatient consider starting low dose basal insulin per patient's weight, Lantus 18-20 units (0.15 units/kg).  Thanks,  Christena Deem RN, MSN, [facility] Inpatient Diabetes Coordinator Team Pager [phone] (8a-5p)

## 2016-01-23 NOTE — Progress Notes (Signed)
OT Cancellation Note  Patient Details Name: NAYDEEN SPEIRS MRN: [id number] DOB: May 13, 1956   Cancelled Treatment:     Assisted PT with use of maximove to get pt back to bed after fall in room. Pt with current bedrest orders. Please D/C bedrest orders in order to initiate OT. Thanks  [facility] Audreena Sachdeva, OTR/L  [phone] 01/23/2016 01/23/2016, 1:37 PM

## 2016-01-23 NOTE — Progress Notes (Signed)
PT Cancellation Note  Patient Details Name: Tiffany Hopkins MRN: [id number] DOB: May 30, 1956   Cancelled Treatment:    Reason Eval/Treat Not Completed: Other (comment)   Orders received, chart reviewed;  As of this morning, [name] is on Bedrest;   Called Dr. Barbaraann Faster office and left a message as we cannot proceed with PT eval until Bedrest is lifted;   Will check back later today, and if activity orders are increased, will proceed with eval; Otherwise will follow up for appropriateness of mobilizing tomorrow;   Thank you,  Van Clines, PT  Acute Rehabilitation Services Pager [phone] Office [phone]     Van Clines [facility] 01/23/2016, 10:27 AM

## 2016-01-23 NOTE — Progress Notes (Signed)
Heard crash sound from hall, upon entering room patient was flat down on her back with back brace on. Per reports patient had struck her head on the floor in the process of falling backwards. Large area of swelling palpated on occipital region of head and patient reported 10/10 pain in head and also pain in back region. Equal and strong grips bilaterally and patient was responsive and conversing appropriately. Nsg entered room and was advised of head strike. Per patient she did not really remember what happened leading to fall. VS obtained by nursing staff. Patient was visibly upset at the time. Team of staff assisted patient with log rolling and use of maxi move lift equipment to elevate patient off the floor and back to bed with head and neck supported and repositioned in the bed.   Charlotte Crumb, PT DPT  [phone]

## 2016-01-23 NOTE — Progress Notes (Signed)
01/23/16 0959  What Happened  Was fall witnessed? Yes  Who witnessed fall? Student RN, Irving Burton  Patients activity before fall bathroom-assisted  Point of contact other (comment);head (per student who witnessed fall, fell straight back onto back)  Follow Up  MD notified Dr. Otelia Sergeant  Time MD notified [phone]  Family notified Yes-comment (Husband in room, daughter arrived shortly after)  Time family notified (at time of fall)  Additional tests Yes-comment  Simple treatment Ice (to back of head)  Progress note created (see row info) Yes  Adult Fall Risk Assessment  Risk Factor Category (scoring not indicated) Fall has occurred during this admission (document High fall risk)  Patient's Fall Risk High Fall Risk (>13 points)  Adult Fall Risk Interventions  Required Bundle Interventions *See Row Information* High fall risk - low, moderate, and high requirements implemented  Additional Interventions Fall risk signage;Individualized elimination schedule;PT/OT need assessed if change in mobility from baseline;Assess orthostatic BP;Secure all tubes/drains;Use of appropriate toileting equipment (bedpan, BSC, etc.)  Fall with Injury Screening  Risk For Fall Injury- See Row Information  Nurse judgement  Intervention(s) for 2 or more risk criteria identified Gait Belt;Low Bed  Vitals  BP (!) 138/94 mmHg  Pain Assessment  Pain Assessment 0-10  Pain Score 9  Pain Type Acute pain  Pain Location Head  Pain Orientation Posterior  Pain Descriptors / Indicators Aching  Multiple Pain Sites Yes  2nd Pain Site  Pain Score 8  Pain Type Acute pain  Pain Location Back  Pain Orientation Mid;Lower  PCA/Epidural/Spinal Assessment  Respiratory Pattern Regular;Unlabored;Symmetrical  Neurological  Neuro (WDL) WDL  Level of Consciousness Alert  Orientation Level Oriented X4  R Hand Grip Strong  L Hand Grip Strong  R Foot Dorsiflexion Moderate  L Foot Dorsiflexion Moderate  R Foot Plantar Flexion Moderate   L Foot Plantar Flexion Moderate  Neuro Additional Assessments No  Musculoskeletal  Musculoskeletal (WDL) X  Assistive Device BSC;Front wheel walker  Generalized Weakness Yes  Weight Bearing Restrictions No    RN entered pt's room after hearing loud noise from the hallway. Entered to find pt lying flat on her back on the floor, back brace was on. On assessment pt alert and oriented x4-did not loose consciousness, no changes in sensation or strength/grips. VSS stable (see vital sign flowsheet). Pt complaining of pain on her head and then her back, rating the head a 9/10 and back 8/10. A large area of swelling was noted on posterior of pt's head. Pt's head was was supported by physical therapist. Dr. Otelia Sergeant notified and updated of situation before pt was moved (within 5 minutes of fall), put in orders for skull and spine xrays. Stated pt could get up if able. Pt was assisted back to bed with maximove by nursing staff and PT/OT. Once back to bed, pts incision was assessed-no drainage/ecchymosis or changes noted. Ice pack was placed behind pt's head, stated her pain was already improved. Student RN and pt stated that she was walking to the bathroom with a walker to brush her teeth from the chair; stated she didn't know what happened to cause her to fall. Student was coming around the foot of the bed when pt fell. This RN unaware that pt was ambulating. Was ambulating with 1 assist around room with walker prior to fall.    1:06 PM  UPDATE: Pt has had skull and spine xray completed, vital signs continue to be stable. BP nearing what they were before fall ranging in low100s/50-60s.  Pts swelling on her head has decreased and stated pain hassignificantly decreased. Will continue to monitor pt.

## 2016-01-23 NOTE — Progress Notes (Signed)
Occupational Therapy Evaluation Patient Details Name: Tiffany Hopkins MRN: [id number] DOB: 1956-04-17 Today's Date: 01/23/2016    History of Present Illness Admitted for Removal of loosened hardware L4-5, right L4-5 foraminotomy, Redo transforaminal lumbar interbody fusion Left L4-5 with cage, left L3-4 resection of synovial cyst, Reinsertion of rods and screws, local and allograft bone graft;  has a past medical history of Anxiety; Depression; COPD (chronic obstructive pulmonary disease) (HCC); Pneumonia; Diabetes mellitus; Seizures (HCC); Arthritis; Hepatitis; PONV (postoperative nausea and vomiting); Heart murmur; Hypertension; Blood clot of vein in shoulder area; Sleep apnea; and OSA on CPAP.   Clinical Impression   PTA, pt mod I with mobility and ADL. PT states she was only able to ambulate @ 20 feet before having to sit. Pt ambulated @ 80 ft this pm @ RW level. No LOB. Began education regarding compensatory techniques and use of AE/DME to adhere to back precautions for ADL. Will follow pt acutely to maximize functional level of independence with ADL and mobility to facilitate safe D/C home with 24/7 S of husband. Feel pt would benefit from [facility] to increase independence and safety with ADL and IADL tasks within the home.    Follow Up Recommendations  Home health OT;Supervision/Assistance - 24 hour (initially)    Equipment Recommendations  3 in 1 bedside comode;Tub/shower bench ((May need wide 3 in1 ))  - will assess   Recommendations for Other Services       Precautions / Restrictions Precautions Precautions: Back Precaution Booklet Issued: Yes (comment) Required Braces or Orthoses: Spinal Brace Spinal Brace: Lumbar corset;Applied in sitting position Restrictions Weight Bearing Restrictions: No      Mobility Bed Mobility Overal bed mobility: Needs Assistance Bed Mobility: Sit to Sidelying         Sit to sidelying: Min assist General bed mobility comments: Pt sitting EOB  on arrival. vc for precuations/log rolling  Transfers Overall transfer level: Needs assistance Equipment used: Rolling walker (2 wheeled) Transfers: Sit to/from Stand Sit to Stand: Min guard              Balance Overall balance assessment: Needs assistance   Sitting balance-Leahy Scale: Good       Standing balance-Leahy Scale: Fair Standing balance comment: fall this am backwards? dizzy?syncope?                            ADL Overall ADL's : Needs assistance/impaired     Grooming: Set up;Supervision/safety;Sitting   Upper Body Bathing: Minimal assitance;Sitting   Lower Body Bathing: Moderate assistance;Sit to/from stand   Upper Body Dressing : Moderate assistance Upper Body Dressing Details (indicate cue type and reason): to donn TLSO Lower Body Dressing: Maximal assistance;Sit to/from stand   Toilet Transfer: BSC;RW;Min guard   Toileting- Clothing Manipulation and Hygiene: Maximal assistance       Functional mobility during ADLs: Minimal assistance;Rolling walker;Cueing for safety General ADL Comments: Pt unable to cross legs over knees for LB ADL. Is interested in learning about AE and seeing tub bench. Husband can assuit with ADL if needed.      Vision     Perception     Praxis      Pertinent Vitals/Pain Pain Assessment: 0-10 Pain Score: 5  Pain Location: back Pain Descriptors / Indicators: Aching;Burning Pain Intervention(s): Limited activity within patient's tolerance;Repositioned;Ice applied     Hand Dominance Right   Extremity/Trunk Assessment Upper Extremity Assessment Upper Extremity Assessment: Overall WFL for tasks assessed  Lower Extremity Assessment Lower Extremity Assessment: Generalized weakness   Cervical / Trunk Assessment Cervical / Trunk Assessment: Normal   Communication Communication Communication: No difficulties   Cognition Arousal/Alertness: Awake/alert Behavior During Therapy: WFL for tasks  assessed/performed Overall Cognitive Status: Within Functional Limits for tasks assessed                     General Comments   Pt very appreciative    Exercises       Shoulder Instructions      Home Living Family/patient expects to be discharged to:: Private residence Living Arrangements: Spouse/significant other Available Help at Discharge: Family;Available 24 hours/day Type of Home: House Home Access: Stairs to enter [employer] of Steps: 4 Entrance Stairs-Rails: Right;Left Home Layout: One level     Bathroom Shower/Tub: Tub/shower unit;Curtain;Door   Bathroom Toilet: Standard Bathroom Accessibility: No   Home Equipment: Environmental consultant - 2 wheels;Shower seat;Electric scooter;Other (comment) (lift chair)          Prior Functioning/Environment Level of Independence: Independent with assistive device(s)             OT Diagnosis: Generalized weakness;Acute pain   OT Problem List: Decreased strength;Decreased range of motion;Decreased activity tolerance;Impaired balance (sitting and/or standing);Decreased knowledge of use of DME or AE;Decreased knowledge of precautions;Obesity;Pain   OT Treatment/Interventions: Self-care/ADL training;DME and/or AE instruction;Therapeutic activities;Patient/family education    OT Goals(Current goals can be found in the care plan section) Acute Rehab OT Goals Patient Stated Goal: stand in the shower, cook her husband dinner adn walk to the mailbox OT Goal Formulation: With patient Time For Goal Achievement: 02/06/16 Potential to Achieve Goals: Good ADL Goals Pt Will Perform Lower Body Bathing: with caregiver independent in assisting;with min guard assist;sit to/from stand;with adaptive equipment Pt Will Perform Lower Body Dressing: with min guard assist;with caregiver independent in assisting;sit to/from stand;with adaptive equipment Pt Will Transfer to Toilet: with modified independence;ambulating;bedside commode  (RW) Pt Will Perform Toileting - Clothing Manipulation and hygiene: with caregiver independent in assisting;sitting/lateral leans;sit to/from stand;with adaptive equipment [employer] of use of AE for hygiene ) Pt Will Perform Tub/Shower Transfer: ambulating;rolling walker;Tub transfer;tub bench;with caregiver independent in assisting;with min guard assist Additional ADL Goal #1: Pt will verbalize 3/3 back precautions for ADL  OT Frequency: Min 2X/week   Barriers to D/C:            Co-evaluation              End of Session Equipment Utilized During Treatment: Gait belt;Rolling walker;Back brace Nurse Communication: Mobility status;Precautions  Activity Tolerance: Patient tolerated treatment well Patient left: in bed;with call bell/phone within reach;with bed alarm set;with family/visitor present   Time: 1620-1650 OT Time Calculation (min): 30 min Charges:  OT General Charges $OT Visit: 1 Procedure OT Evaluation $OT Eval Moderate Complexity: 1 Procedure OT Treatments $Self Care/Home Management : 8-22 mins G-Codes:    Dequavion Follette,HILLARY 24-Jan-2016, 5:02 PM   Suellen Durocher, OTR/L  [phone] 2016-01-24

## 2016-01-23 NOTE — Progress Notes (Addendum)
Xrays without change skull neg for fx, and lumbar spine with  Hardware, no acutes changes. SBP in the 90s. Will restart IVF with normal saline, bolus with 500 cc over one hour then 100cc/hr IVR.  No KCL due to prinivil.  Discussed elevated blood sugars with patient, she normally Takes 20U of humulog R TID. Her BMET showed glucose of  244. Usually better control on invokana and humalog. I requested [facility] Consult and Dr. Konrad Dolores will see Her . As xrays are normal she should be able to resume PT and OT after IVF bolus and restarting IVF at 100cc/hr.  Also problems with itching attributed to opiod sensitivity request a stronger anti prurtitic. Stopped benadry and will try atarax  q 6 prn itching.

## 2016-01-23 NOTE — Care Management Note (Signed)
Case Management Note  Patient Details  Name: Tiffany Hopkins MRN: [id number] Date of Birth: 28-Dec-1955  Subjective/Objective:       S/p redo TLIF L4-5             Action/Plan: Contacted [employer], company that sets up [facility] for patient's insurance, to set up HHPT. Given intake # [id number] and faxed order, H and P, op note and facesheet to [phone]. Received callback that fax was received. Received call from [facility] at [facility], they have patient set up with [facility] tel # [phone], fax #[phone]. Contacted [facility], they are planning to start service on 01/28/16. Will continue to follow for discharge needs.   Expected Discharge Date:                  Expected Discharge Plan:  Home w Home Health Services  In-House Referral:  NA  Discharge planning Services  CM Consult  Post Acute Care Choice:  Home Health Choice offered to:     DME Arranged:    DME Agency:     HH Arranged:  PT HH Agency:     Status of Service:  In process, will continue to follow  Medicare Important Message Given:    Date Medicare IM Given:    Medicare IM give by:    Date Additional Medicare IM Given:    Additional Medicare Important Message give by:     If discussed at Long Length of Stay Meetings, dates discussed:    Additional Comments:  Monica Becton, RN 01/23/2016, 3:56 PM

## 2016-01-23 NOTE — Progress Notes (Signed)
     Subjective: 1 Day Post-Op Procedure(s) (LRB): Removal of loosened hardware L4-5, right L4-5 foraminotomy, Redo transforaminal lumbar interbody fusion Left L4-5 with cage, left L3-4 resection of synovial cyst, Reinsertion of rods and screws, local and allograft bone graft (N/A) Awake, alert and oriented x 4. Awakened and able to roll easily to her side then sit up and cross legs sitting on the bed. Leg pain is improved. Patient reports pain as moderate.    Objective:   VITALS:  Temp:  [98 F (36.7 C)-98.9 F (37.2 C)] 98.9 F (37.2 C) (03/01 1400) Pulse Rate:  [68-84] 72 (03/01 1400) Resp:  [16-18] 16 (03/01 1400) BP: (92-151)/(48-94) 96/48 mmHg (03/01 1400) SpO2:  [93 %-98 %] 93 % (03/01 1400)  Neurologically intact ABD soft Neurovascular intact Sensation intact distally Intact pulses distally Dorsiflexion/Plantar flexion intact Incision: no drainage and dressing is off, apparently coming off in bed, tegaderm and 4x4 applied.   LABS  Recent Labs  01/23/16 0452  HGB 12.2  WBC 9.8  PLT 150    Recent Labs  01/23/16 0452  NA 133*  K 5.2*  CL 100*  CO2 26  BUN 18  CREATININE 1.05*  GLUCOSE 244*   No results for input(s): LABPT, INR in the last 72 hours.   Assessment/Plan: 1 Day Post-Op Procedure(s) (LRB): Removal of loosened hardware L4-5, right L4-5 foraminotomy, Redo transforaminal lumbar interbody fusion Left L4-5 with cage, left L3-4 resection of synovial cyst, Reinsertion of rods and screws, local and allograft bone graft (N/A)  Elevated blood sugars 244, will request for assistance with  This via hospitalist due to invokana and to adjust.  Advance diet Up with therapy D/C IV fluids Discharge home with home health when she is stable in 2 days  NITKA,JAMES E 01/23/2016, 3:11 PM

## 2016-01-23 NOTE — Anesthesia Postprocedure Evaluation (Signed)
Anesthesia Post Note  Patient: Tiffany Hopkins  Procedure(s) Performed: Procedure(s) (LRB): Removal of loosened hardware L4-5, right L4-5 foraminotomy, Redo transforaminal lumbar interbody fusion Left L4-5 with cage, left L3-4 resection of synovial cyst, Reinsertion of rods and screws, local and allograft bone graft (N/A)  Patient location during evaluation: PACU Anesthesia Type: General Level of consciousness: awake and alert Pain management: pain level controlled Vital Signs Assessment: post-procedure vital signs reviewed and stable Respiratory status: spontaneous breathing, nonlabored ventilation, respiratory function stable and patient connected to nasal cannula oxygen Cardiovascular status: blood pressure returned to baseline and stable Postop Assessment: no signs of nausea or vomiting Anesthetic complications: no    Last Vitals:  Filed Vitals:   01/22/16 2300 01/23/16 0500  BP: 101/48 92/62  Pulse: 76 68  Temp: 36.7 C 36.8 C  Resp: 16 16    Last Pain:  Filed Vitals:   01/23/16 0846  PainSc: 6                  Cecile Hearing

## 2016-01-23 NOTE — Progress Notes (Signed)
Received call this AM patient reportedly fell and hit her head on floor. No specific complaints. SBP in the 90s. IVF initially NS with KCL at 100 cc/hr, stopped due to K=5.2.  No reported LOC. Does have a bruise on the back of her scalp. POD#1 revision lumbar fusion, will order skull xrays and  Lumbar spine xrays to document if there is any changes. Hold PT and place at bedrest till we know results of xrays.

## 2016-01-23 NOTE — Consult Note (Signed)
[facility] Medical Consultation  Tiffany Hopkins WUJ:[id number] DOB: [birth year] DOA: 01/22/2016 PCP: Deforest Hoyles, MD   Requesting physician Barry Dienes, MD Date of consultation: 01/23/16 Reason for consultation: Management of DM, Hyperkalemia and Hypertension  Impression/Recommendations Principal Problem:   Pseudarthrosis following spinal fusion Active Problems:   Spinal stenosis, lumbar region, with neurogenic claudication   Painful orthopaedic hardware  Type II Diabetes, insulin dependent  Last A1C 7.8 on 01/22/16    Current blood sugar level is 244 - Hold home oral diabetic medications.  - SSI  - Heart healthy carb modified diet. - resume home DM medications at time of DC  Hypotension  BP 99/49 mmHg  Pulse 67  Temp(Src) 98.4 F (36.9 C) (Oral)  Resp 16  SpO2 94%  - Received IV fluid bolus Hold BP home meds for now - Monitor BP closely - If BP rises again, resume home BP except for lisinopril at this time.  - Hydralazine for BP >210 systolic and 110 diastolic   Hyperkalemia likely due to diet, lisinopril. Current K 5.2.  No Kayexalate is recommended at this time - Recommend insulin, diabetic diet and hold lisinopril - Recheck BMET at 6pm - Repeat EKG as indicated  Other medical issues as per admitting team   HPI: 60 year old woman with a history of hypertension, and diabetes, admitted for back surgery, POD 1 Redo transforaminal lumbar interbody fusion. She tolerated surgery well. Today, She was noted to have elevated blood sugars, at 244, K 5.2, and hypotensive, reportedly falling due to low blood pressure,  requiring IV bolus fluid with NS with improvement , currently at 99/49. SHe is afebrile.  [facility] requested to help in the management of these primary care issues.   Review of Systems:  Constitutional:  No weight loss, night sweats, fevers, chills, fatigue HEENT: No headaches, difficulty swallowing,tooth/dental problems, sore throat Cardio-vascular:  No  chest pain, orthopnea, PND, swelling in lower extremities, anasarca, dizziness, palpitations  GI:  No heartburn, indigestion, abdominal pain, nausea, vomiting, diarrhea, change in bowel habits, loss of appetite  Respiratory:  No shortness of breath with exertion or at rest. No excess mucus, no productive cough, No non-productive cough, No coughing up of blood. No change in color of mucus. No wheezing .No chest wall deformity  Skin:  No rash or lesions.  GU:  no dysuria, change in color of urine, no urgency or frequency. No flank pain.  Musculoskeletal:   No joint pain or swelling. No decreased range of motion. + back pain. Post surgical.  Psych:  No change in mood or affect. No depression or anxiety. No memory loss.  Neuro:  No change in sensation, unilateral strength, or cognitive abilities  All other systems were reviewed and are negative.  Past Medical History  Diagnosis Date  . Anxiety   . Depression   . COPD (chronic obstructive pulmonary disease) (HCC)   . Pneumonia     hx.  . Diabetes mellitus   . Seizures (HCC)     as child epilopsy none as adult  . Arthritis   . Hepatitis   . PONV (postoperative nausea and vomiting)   . Heart murmur     no  cardiologist  . Hypertension   . Blood clot of vein in shoulder area     at age 36  . Sleep apnea   . OSA on CPAP     settings 5 to 15   Past Surgical History  Procedure Laterality Date  . Abdominal hysterectomy    .  Cholecystectomy    . Fracture surgery      right hip fracture,pins  . Rotator cuff repair      right  . Bone spur    .  blood cloth removed      right shoulder  . Tendonititis      both hands  . Eye surgery  bleophoplasty bilateral  . Bladder suspension    . Breast lumpectomy Left 2016    at [facility]  . Back surgery    . Appendectomy    . Tubal ligation     Social History:  reports that she quit smoking about 5 years ago. Her smoking use included Cigarettes. She has a 30 pack-year smoking  history. She has never used smokeless tobacco. She reports that she does not drink alcohol or use illicit drugs.  Allergies  Allergen Reactions  . Alprazolam Other (See Comments)    FATIGUE, MALAISE Lethargy  . Hydromorphone Hcl Itching and Rash    Makes skin feel like spiders crawling over her  . Glipizide Nausea And Vomiting     GI Upset  . Metformin Nausea And Vomiting    GI UPSET  . Pioglitazone Nausea And Vomiting    GI UPSET   History reviewed. No pertinent family history.  Prior to Admission medications   Medication Sig Start Date End Date Taking? Authorizing Provider  cholecalciferol (VITAMIN D) 1000 units tablet Take 1,000 Units by mouth daily.   Yes Historical Provider, MD  clotrimazole-betamethasone (LOTRISONE) cream Apply 1 application topically daily. 01/02/16  Yes Historical Provider, MD  HUMULIN R 500 UNIT/ML injection Inject 20 Units into the skin 3 (three) times daily.  11/30/15  Yes Historical Provider, MD  hydrochlorothiazide (HYDRODIURIL) 25 MG tablet Take 1 tablet by mouth daily as needed.  10/08/15  Yes Historical Provider, MD  INVOKANA 100 MG TABS tablet Take 100 mg by mouth daily. 12/09/15  Yes Historical Provider, MD  lisinopril (PRINIVIL,ZESTRIL) 5 MG tablet Take 5 mg by mouth daily.   Yes Historical Provider, MD  sertraline (ZOLOFT) 50 MG tablet Take 1 tablet by mouth at bedtime.  01/09/16  Yes Historical Provider, MD   Physical Exam: Blood pressure 99/49, pulse 67, temperature 98.4 F (36.9 C), temperature source Oral, resp. rate 16, SpO2 94 %. Filed Vitals:   01/23/16 1600 01/23/16 1706  BP: 98/49 99/49  Pulse: 67   Temp: 98.4 F (36.9 C)   Resp: 16    General: Appears calm and comfortable. Limited exam as patient in a brace due to pod 1 s/p surgery  Eyes:  PERRL, EOMI, normal lids, iris ENT: grossly normal hearing, lips & tongue Neck: no lymphadenopathy, masses or thyromegaly Cardiovascular: regular rate and rythm, no murmurs, rubs or gallops. trace  extremity edema  Respiratory: clear to auscultation bilaterally, no wheezing, rhonhci or rales. Normal respiratory effort. Abdomen: soft,non-tender, normal bowel sounds Skin: no rash or induration seen on limited exam. No open lesions. Musculoskeletal:  grossly normal tone in both upper and lower extremities Psychiatric: grossly normal mood and affect, speech fluent and appropriate Neurologic: CN 2-12 grossly intact, moves all extremities in coordinated fashion.          Labs on Admission:  Basic Metabolic Panel:  Recent Labs Lab 01/17/16 1422 01/23/16 0452  NA 139 133*  K 4.2 5.2*  CL 103 100*  CO2 25 26  GLUCOSE 145* 244*  BUN 11 18  CREATININE 0.86 1.05*  CALCIUM 9.3 8.1*   Liver Function Tests:  Recent  Labs Lab 01/17/16 1422  AST 24  ALT 29  ALKPHOS 68  BILITOT 0.7  PROT 6.5  ALBUMIN 3.8   No results for input(s): LIPASE, AMYLASE in the last 168 hours. No results for input(s): AMMONIA in the last 168 hours. CBC:  Recent Labs Lab 01/17/16 1422 01/23/16 0452  WBC 7.9 9.8  HGB 15.6* 12.2  HCT 49.0* 38.3  MCV 92.6 91.8  PLT 166 150   Cardiac Enzymes: No results for input(s): CKTOTAL, CKMB, CKMBINDEX, TROPONINI in the last 168 hours. BNP: Invalid input(s): POCBNP CBG:  Recent Labs Lab 01/22/16 1733 01/22/16 2129 01/23/16 0642 01/23/16 1217 01/23/16 1603  GLUCAP 335* 296* 216* 200* 207*    Radiological Exams on Admission: Dg Skull Complete  01/23/2016  CLINICAL DATA:  Injury.  Fall today, dizziness. EXAM: SKULL - COMPLETE 4 + VIEW COMPARISON:  None. FINDINGS: There is no evidence of skull fracture or other focal bone lesions. IMPRESSION: Negative. Electronically Signed   By: Charlett Nose M.D.   On: 01/23/2016 11:39   Dg Lumbar Spine Complete  01/23/2016  CLINICAL DATA:  Fall, dizziness, status post lumbar surgery yesterday EXAM: LUMBAR SPINE - COMPLETE 4+ VIEW COMPARISON:  Intraoperative radiographs dated 01/22/2016 FINDINGS: Five lumbar type  vertebral bodies. Normal lumbar lordosis. No evidence of fracture or dislocation. Vertebral heights are maintained. Status post L4-5 PLIF. No evidence of hardware fracture or loosening. Mild multilevel degenerative changes. Visualized bony pelvis appears intact. IMPRESSION: No fracture or dislocation is seen. Status post L4-5 PLIF, without evidence of complication. Mild degenerative changes. Electronically Signed   By: Charline Bills M.D.   On: 01/23/2016 11:40   Dg Lumbar Spine Complete  01/22/2016  CLINICAL DATA:  Removal of failed hardware at L4-5, right L4-5 foraminotomy, redo trans foraminal lumbar interbody fusion with cage at L4-5, resection of left synovial cyst at L3-4. EXAM: Operative LUMBAR SPINE - COMPLETE 4+ VIEW COMPARISON:  MRI lumbar spine 08/27/2015. FINDINGS: AP, lateral and bilateral oblique spot images of the lumbar spine are submitted for interpretation postoperatively. L4-5 PLIF with interbody fusion plugs appropriately positioned in the disc space. Anatomic alignment on the lateral image. IMPRESSION: Anatomic alignment post L4-5 PLIF with interbody fusion plugs appropriately positioned in the L4-5 disc space. Electronically Signed   By: Hulan Saas M.D.   On: 01/22/2016 12:46   Dg C-arm 1-60 Min  01/22/2016  CLINICAL DATA:  Removal of failed hardware at L4-5, right L4-5 foraminotomy, redo trans foraminal lumbar interbody fusion with cage at L4-5, resection of left synovial cyst at L3-4. EXAM: Operative LUMBAR SPINE - COMPLETE 4+ VIEW COMPARISON:  MRI lumbar spine 08/27/2015. FINDINGS: AP, lateral and bilateral oblique spot images of the lumbar spine are submitted for interpretation postoperatively. L4-5 PLIF with interbody fusion plugs appropriately positioned in the disc space. Anatomic alignment on the lateral image. IMPRESSION: Anatomic alignment post L4-5 PLIF with interbody fusion plugs appropriately positioned in the L4-5 disc space. Electronically Signed   By: Hulan Saas M.D.   On: 01/22/2016 12:46      [facility]   If 7PM-7AM, please contact night-coverage [email or web address] Password [email or web address]     Shelly Flatten, MD [facility] 01/23/2016, 6:03 PM

## 2016-01-24 ENCOUNTER — Inpatient Hospital Stay (HOSPITAL_COMMUNITY): Payer: Managed Care, Other (non HMO)

## 2016-01-24 DIAGNOSIS — I950 Idiopathic hypotension: Secondary | ICD-10-CM

## 2016-01-24 DIAGNOSIS — E875 Hyperkalemia: Secondary | ICD-10-CM

## 2016-01-24 DIAGNOSIS — M960 Pseudarthrosis after fusion or arthrodesis: Secondary | ICD-10-CM

## 2016-01-24 LAB — CBC WITH DIFFERENTIAL/PLATELET
BASOS PCT: 0 %
Basophils Absolute: 0 10*3/uL (ref 0.0–0.1)
EOS ABS: 0.1 10*3/uL (ref 0.0–0.7)
EOS PCT: 2 %
HEMATOCRIT: 37 % (ref 36.0–46.0)
Hemoglobin: 11.6 g/dL — ABNORMAL LOW (ref 12.0–15.0)
Lymphocytes Relative: 30 %
Lymphs Abs: 2 10*3/uL (ref 0.7–4.0)
MCH: 29.4 pg (ref 26.0–34.0)
MCHC: 31.4 g/dL (ref 30.0–36.0)
MCV: 93.7 fL (ref 78.0–100.0)
MONO ABS: 0.9 10*3/uL (ref 0.1–1.0)
MONOS PCT: 13 %
Neutro Abs: 3.8 10*3/uL (ref 1.7–7.7)
Neutrophils Relative %: 55 %
PLATELETS: 125 10*3/uL — AB (ref 150–400)
RBC: 3.95 MIL/uL (ref 3.87–5.11)
RDW: 15 % (ref 11.5–15.5)
WBC: 6.9 10*3/uL (ref 4.0–10.5)

## 2016-01-24 LAB — GLUCOSE, CAPILLARY
GLUCOSE-CAPILLARY: 183 mg/dL — AB (ref 65–99)
Glucose-Capillary: 169 mg/dL — ABNORMAL HIGH (ref 65–99)
Glucose-Capillary: 196 mg/dL — ABNORMAL HIGH (ref 65–99)
Glucose-Capillary: 175 mg/dL — ABNORMAL HIGH (ref 65–99)

## 2016-01-24 LAB — BASIC METABOLIC PANEL
Anion gap: 7 (ref 5–15)
BUN: 16 mg/dL (ref 6–20)
CALCIUM: 8.3 mg/dL — AB (ref 8.9–10.3)
CO2: 26 mmol/L (ref 22–32)
CREATININE: 0.8 mg/dL (ref 0.44–1.00)
Chloride: 104 mmol/L (ref 101–111)
Glucose, Bld: 216 mg/dL — ABNORMAL HIGH (ref 65–99)
Potassium: 4.3 mmol/L (ref 3.5–5.1)
SODIUM: 137 mmol/L (ref 135–145)

## 2016-01-24 LAB — CORTISOL: Cortisol, Plasma: 2.7 ug/dL

## 2016-01-24 NOTE — Evaluation (Signed)
Physical Therapy Evaluation Patient Details Name: Tiffany Hopkins MRN: [id number] DOB: 28-Apr-1956 Today's Date: 01/24/2016   History of Present Illness  Admitted for Removal of loosened hardware L4-5, right L4-5 foraminotomy, Redo transforaminal lumbar interbody fusion Left L4-5 with cage, left L3-4 resection of synovial cyst, Reinsertion of rods and screws, local and allograft bone graft;  has a past medical history of Anxiety; Depression; COPD (chronic obstructive pulmonary disease) (HCC); Pneumonia; Diabetes mellitus; Seizures (HCC); Arthritis; Hepatitis; PONV (postoperative nausea and vomiting); Heart murmur; Hypertension; Blood clot of vein in shoulder area; Sleep apnea; and OSA on CPAP.  Clinical Impression  Patient presents with problems listed below.  Will benefit from acute PT to maximize functional mobility prior to discharge home with husband.  Patient with no report of dizziness during session.  No drop in BP noted (see vitals below).  Recommend HHPT at discharge for continued therapy.    Follow Up Recommendations Home health PT;Supervision for mobility/OOB    Equipment Recommendations  3in1 (PT)    Recommendations for Other Services       Precautions / Restrictions Precautions Precautions: Back;Fall Precaution Comments: Reviewed back precautions Required Braces or Orthoses: Spinal Brace Spinal Brace: Lumbar corset;Applied in sitting position Restrictions Weight Bearing Restrictions: No      Mobility  Bed Mobility Overal bed mobility: Needs Assistance Bed Mobility: Sidelying to Sit   Sidelying to sit: Min guard       General bed mobility comments: Assist for safety only.  Patient in sidelying as PT entered room.  Able to move to sitting using correct technique.  Transfers Overall transfer level: Needs assistance Equipment used: Rolling walker (2 wheeled) Transfers: Sit to/from Stand Sit to Stand: Min guard         General transfer comment: Patient using  correct hand placement and technique.  Ambulation/Gait Ambulation/Gait assistance: Min assist;+2 safety/equipment Ambulation Distance (Feet): 110 Feet Assistive device: Rolling walker (2 wheeled) Gait Pattern/deviations: Step-through pattern;Decreased stride length Gait velocity: decreased Gait velocity interpretation: Below normal speed for age/gender General Gait Details: Slow steady gait pattern.  No loss of balance.  Followed with chair for safety.  BP was 99/48 in sitting and 115/56 following ambulation in sitting.  Stairs            Wheelchair Mobility    Modified Rankin (Stroke Patients Only)       Balance                                             Pertinent Vitals/Pain Pain Assessment: 0-10 Pain Score: 4  Pain Location: Back-incision site Pain Descriptors / Indicators: Sore Pain Intervention(s): Monitored during session;Repositioned;Ice applied   BP in sitting: 99/48 BP following gait training:  115/56 No dizziness reported during gait    Home Living Family/patient expects to be discharged to:: Private residence Living Arrangements: Spouse/significant other Available Help at Discharge: Family;Available 24 hours/day Type of Home: House Home Access: Stairs to enter Entrance Stairs-Rails: Doctor, general practice of Steps: 4 Home Layout: One level Home Equipment: Walker - 2 wheels;Shower seat;Electric scooter;Other (comment) (Lift chair)      Prior Function Level of Independence: Independent with assistive device(s)               Hand Dominance   Dominant Hand: Right    Extremity/Trunk Assessment   Upper Extremity Assessment: Overall WFL for tasks assessed  Lower Extremity Assessment: Generalized weakness         Communication   Communication: No difficulties  Cognition Arousal/Alertness: Awake/alert Behavior During Therapy: WFL for tasks assessed/performed Overall Cognitive Status: Within  Functional Limits for tasks assessed                      General Comments      Exercises        Assessment/Plan    PT Assessment Patient needs continued PT services  PT Diagnosis Difficulty walking;Generalized weakness;Acute pain   PT Problem List Decreased strength;Decreased activity tolerance;Decreased balance;Decreased mobility;Decreased knowledge of use of DME;Decreased knowledge of precautions;Cardiopulmonary status limiting activity;Pain  PT Treatment Interventions DME instruction;Gait training;Stair training;Functional mobility training;Therapeutic activities;Patient/family education   PT Goals (Current goals can be found in the Care Plan section) Acute Rehab PT Goals Patient Stated Goal: Walk to the mailbox PT Goal Formulation: With patient Time For Goal Achievement: 01/31/16 Potential to Achieve Goals: Good    Frequency Min 5X/week   Barriers to discharge        Co-evaluation               End of Session Equipment Utilized During Treatment: Gait belt;Back brace Activity Tolerance: Patient tolerated treatment well Patient left: in chair;with call bell/phone within reach;with family/visitor present Nurse Communication: Mobility status         Time: 1202-1216 PT Time Calculation (min) (ACUTE ONLY): 14 min   Charges:   PT Evaluation $PT Eval Moderate Complexity: 1 Procedure     PT G CodesVena Austria February 08, 2016, 12:57 PM Durenda Hurt. Renaldo Fiddler, [facility] Acute Rehab Services Pager [phone]

## 2016-01-24 NOTE — Clinical Social Work Note (Signed)
CSW received referral for SNF.  Case discussed with case manager and plan is to discharge home.  CSW to sign off please re-consult if social work needs arise.  Selim Durden R. Ivi Griffith, MSW, LCSWA [phone]  

## 2016-01-24 NOTE — Progress Notes (Signed)
[facility] PROGRESS NOTE  Tiffany Hopkins ZOX:[id number] DOB: May 20, 1956 DOA: 01/22/2016 PCP: Deforest Hoyles, MD   Assessment/Plan: Type II Diabetes, insulin dependent Last A1C 7.8 on 01/22/16 Current blood sugar level is 244 - Hold home oral diabetic medications.  - SSI  - Heart healthy carb modified diet. - recommend resume home DM medications at time of DC  Hypotension BP 99/49 mmHg  Pulse 67  Temp(Src) 98.4 F (36.9 C) (Oral)  Resp 16  SpO2 94%  - Received IV fluid bolus - Continuing to hold home BP meds for now - Chart reviewed. Pt historically had sbp in the low 100's from at least 2013. - Monitor BP closely - If BP rises again, consider resuming home HCTZ alone - Hydralazine for BP >210 systolic and 110 diastolic   Hyperkalemia likely due to diet, lisinopril. Current K 5.2.  - Recommend insulin, diabetic diet and hold lisinopril - Resolved  Recent fall - Cranial xray neg for fracture - have ordered CT head w/o contrast for completeness, findings of soft tissue scalp swelling, otherwise no acute hemorrhage noted   Antibiotics: Anti-infectives    Start     Dose/Rate Route Frequency Ordered Stop   01/22/16 2000  ceFAZolin (ANCEF) IVPB 1 g/50 mL premix     1 g 100 mL/hr over 30 Minutes Intravenous Every 8 hours 01/22/16 1431 01/23/16 0406   01/22/16 0700  ceFAZolin (ANCEF) 3 g in dextrose 5 % 50 mL IVPB     3 g 130 mL/hr over 30 Minutes Intravenous To ShortStay Surgical 01/21/16 1351 01/22/16 1223       HPI/Subjective: Complains of continued back pain.   Objective: Filed Vitals:   01/24/16 0126 01/24/16 0345 01/24/16 0535 01/24/16 1337  BP: 97/40 104/53 90/56 101/68  Pulse: 76 70 75 78  Temp: 98.3 F (36.8 C) 98.2 F (36.8 C) 98.5 F (36.9 C) 98.6 F (37 C)  TempSrc: Oral Oral Oral   Resp: 18 18 18 16   SpO2: 95% 94% 94% 99%    Intake/Output Summary (Last 24 hours) at 01/24/16 1650 Last data filed at 01/24/16 0730  Gross per 24 hour   Intake 1476.66 ml  Output    300 ml  Net 1176.66 ml   There were no vitals filed for this visit.  Exam:   General:  Awake, sitting in bed  Cardiovascular: regular, s1, s2  Respiratory: normal resp effort, no wheezing  Abdomen: soft,nondistended  Musculoskeletal: perfused, no clubbing   Data Reviewed: Basic Metabolic Panel:  Recent Labs Lab 01/23/16 0452 01/24/16 0400  NA 133* 137  K 5.2* 4.3  CL 100* 104  CO2 26 26  GLUCOSE 244* 216*  BUN 18 16  CREATININE 1.05* 0.80  CALCIUM 8.1* 8.3*   Liver Function Tests: No results for input(s): AST, ALT, ALKPHOS, BILITOT, PROT, ALBUMIN in the last 168 hours. No results for input(s): LIPASE, AMYLASE in the last 168 hours. No results for input(s): AMMONIA in the last 168 hours. CBC:  Recent Labs Lab 01/23/16 0452 01/24/16 0400  WBC 9.8 6.9  NEUTROABS  --  3.8  HGB 12.2 11.6*  HCT 38.3 37.0  MCV 91.8 93.7  PLT 150 125*   Cardiac Enzymes: No results for input(s): CKTOTAL, CKMB, CKMBINDEX, TROPONINI in the last 168 hours. BNP (last 3 results) No results for input(s): BNP in the last 8760 hours.  ProBNP (last 3 results) No results for input(s): PROBNP in the last 8760 hours.  CBG:  Recent Labs Lab 01/23/16 1603 01/23/16  2132 01/24/16 0635 01/24/16 1119 01/24/16 1600  GLUCAP 207* 180* 169* 196* 175*    Recent Results (from the past 240 hour(s))  Surgical pcr screen     Status: None   Collection Time: 01/17/16  2:27 PM  Result Value Ref Range Status   MRSA, PCR NEGATIVE NEGATIVE Final   Staphylococcus aureus NEGATIVE NEGATIVE Final    Comment:        The Xpert SA Assay (FDA approved for NASAL specimens in patients over 59 years of age), is one component of a comprehensive surveillance program.  Test performance has been validated by [facility] for patients greater than or equal to 80 year old. It is not intended to diagnose infection nor to guide or monitor treatment.      Studies: Dg  Skull Complete  01/23/2016  CLINICAL DATA:  Injury.  Fall today, dizziness. EXAM: SKULL - COMPLETE 4 + VIEW COMPARISON:  None. FINDINGS: There is no evidence of skull fracture or other focal bone lesions. IMPRESSION: Negative. Electronically Signed   By: Charlett Nose M.D.   On: 01/23/2016 11:39   Dg Lumbar Spine Complete  01/23/2016  CLINICAL DATA:  Fall, dizziness, status post lumbar surgery yesterday EXAM: LUMBAR SPINE - COMPLETE 4+ VIEW COMPARISON:  Intraoperative radiographs dated 01/22/2016 FINDINGS: Five lumbar type vertebral bodies. Normal lumbar lordosis. No evidence of fracture or dislocation. Vertebral heights are maintained. Status post L4-5 PLIF. No evidence of hardware fracture or loosening. Mild multilevel degenerative changes. Visualized bony pelvis appears intact. IMPRESSION: No fracture or dislocation is seen. Status post L4-5 PLIF, without evidence of complication. Mild degenerative changes. Electronically Signed   By: Charline Bills M.D.   On: 01/23/2016 11:40   Ct Head Wo Contrast  01/24/2016  CLINICAL DATA:  Syncopal episode and fall yesterday while using the restroom. The patient has a palpable nodule on her head. EXAM: CT HEAD WITHOUT CONTRAST TECHNIQUE: Contiguous axial images were obtained from the base of the skull through the vertex without intravenous contrast. COMPARISON:  None. FINDINGS: Soft tissue scalp swelling is present over the occipital scalp to the right of midline extending to the vertex. There is no underlying fracture. No acute infarct, hemorrhage, or mass lesion is present. The ventricles are of normal size. No significant extra-axial fluid collection is present. The paranasal sinuses and mastoid air cells are clear. The calvarium is otherwise intact. Atherosclerotic calcifications are present in the cavernous internal carotid arteries. No other significant extracranial soft tissue injury is present. The globes and orbits are intact. IMPRESSION: 1. Soft tissue scalp  swelling over the right paramedian occipital scalp without an underlying fracture. 2. Normal CT appearance of the brain. 3. Minimal atherosclerotic calcifications. Electronically Signed   By: Marin Roberts M.D.   On: 01/24/2016 12:56    Scheduled Meds: . clotrimazole   Topical BID  . docusate sodium  100 mg Oral BID  . insulin aspart  0-20 Units Subcutaneous TID WC  . insulin aspart  4 Units Subcutaneous TID WC  . pantoprazole  40 mg Oral Daily  . scopolamine  1 patch Transdermal Once  . sertraline  50 mg Oral QHS  . sodium chloride flush  3 mL Intravenous Q12H   Continuous Infusions: . sodium chloride    . sodium chloride 100 mL/hr at 01/23/16 2320    Principal Problem:   Pseudarthrosis following spinal fusion Active Problems:   Spinal stenosis, lumbar region, with neurogenic claudication   Painful orthopaedic hardware   Idiopathic hypotension  Diabetes mellitus with complication (HCC)   Hyperkalemia  Joss Mcdill K  [facility] Pager [phone]. If 7PM-7AM, please contact night-coverage at [email or web address], password [facility] 01/24/2016, 4:50 PM  LOS: 2 days

## 2016-01-24 NOTE — Progress Notes (Addendum)
Subjective: Doing well. Pain controlled.    Objective: Vital signs in last 24 hours: Temp:  [98.2 F (36.8 C)-99 F (37.2 C)] 98.5 F (36.9 C) (03/02 0535) Pulse Rate:  [53-84] 75 (03/02 0535) Resp:  [14-18] 18 (03/02 0535) BP: (86-151)/(35-94) 90/56 mmHg (03/02 0535) SpO2:  [93 %-98 %] 94 % (03/02 0535)  Intake/Output from previous day: 03/01 0701 - 03/02 0700 In: 2196.7 [P.O.:1200; I.V.:996.7] Out: 300 [Urine:300] Intake/Output this shift:     Recent Labs  01/23/16 0452 01/24/16 0400  HGB 12.2 11.6*    Recent Labs  01/23/16 0452 01/24/16 0400  WBC 9.8 6.9  RBC 4.17 3.95  HCT 38.3 37.0  PLT 150 125*    Recent Labs  01/23/16 0452 01/24/16 0400  NA 133* 137  K 5.2* 4.3  CL 100* 104  CO2 26 26  BUN 18 16  CREATININE 1.05* 0.80  GLUCOSE 244* 216*  CALCIUM 8.1* 8.3*   No results for input(s): LABPT, INR in the last 72 hours.  Exam:  Alert and oriented x 3. No distress.  Wound looks good.  steris intact.  No drainage or signs of infection.  NVI. No focal motor deficits.   Assessment/Plan: Hypotensive.  Medicine following and appreciate the help. Continue to mobilize but must be careful.  Ordered serum cortisol level.    OWENS,Levonne Carreras M 01/24/2016, 8:22 AM     Patient examined and lab reviewed with Barry Dienes, PA-C.

## 2016-01-25 LAB — GLUCOSE, CAPILLARY
Glucose-Capillary: 193 mg/dL — ABNORMAL HIGH (ref 65–99)
Glucose-Capillary: 195 mg/dL — ABNORMAL HIGH (ref 65–99)

## 2016-01-25 MED ORDER — HYDROXYZINE HCL 50 MG PO TABS: 50.0000 mg | ORAL_TABLET | Freq: Four times a day (QID) | ORAL | Status: AC | PRN

## 2016-01-25 MED ORDER — OXYCODONE-ACETAMINOPHEN 5-325 MG PO TABS: 1.0000 | ORAL_TABLET | ORAL | Status: AC | PRN

## 2016-01-25 MED ORDER — METHOCARBAMOL 500 MG PO TABS: 500.0000 mg | ORAL_TABLET | Freq: Four times a day (QID) | ORAL | Status: AC | PRN

## 2016-01-25 NOTE — Discharge Summary (Signed)
Physician Discharge Summary      Patient ID: Tiffany Hopkins DOB/AGE: 12-10-1955 60 y.o.  Admit date: 01/22/2016 Discharge date: 01/25/2016  Admission Diagnoses:  Principal Problem:   Pseudarthrosis following spinal fusion Active Problems:   Spinal stenosis, lumbar region, with neurogenic claudication   Painful orthopaedic hardware   Idiopathic hypotension   Diabetes mellitus with complication (HCC)   Hyperkalemia   Discharge Diagnoses:  Same  Past Medical History  Diagnosis Date  . Anxiety   . Depression   . COPD (chronic obstructive pulmonary disease) (HCC)   . Pneumonia     hx.  . Diabetes mellitus   . Seizures (HCC)     as child epilopsy none as adult  . Arthritis   . Hepatitis   . PONV (postoperative nausea and vomiting)   . Heart murmur     no  cardiologist  . Hypertension   . Blood clot of vein in shoulder area     at age 60  . Sleep apnea   . OSA on CPAP     settings 5 to 15    Surgeries: Procedure(s): Removal of loosened hardware L4-5, right L4-5 foraminotomy, Redo transforaminal lumbar interbody fusion Left L4-5 with cage, left L3-4 resection of synovial cyst, Reinsertion of rods and screws, local and allograft bone graft on 01/22/2016   Consultants:  Dr. Shelly Flattenavid Merrell, MD.  Discharged Condition: Improved  Hospital Course: Tiffany FishShelby D Carfagno is an 60 y.o. female who was admitted 01/22/2016 with a chief complaint of No chief complaint on file. and found to have a diagnosis of Pseudarthrosis following spinal fusionShe was brought to the operating room on 01/22/2016 and underwent the above named procedures.    She was given perioperative antibiotics:  Anti-infectives    Start     Dose/Rate Route Frequency Ordered Stop   01/22/16 2000  ceFAZolin (ANCEF) IVPB 1 g/50 mL premix     1 g 100 mL/hr over 30 Minutes Intravenous Every 8 hours 01/22/16 1431 01/23/16 0406   01/22/16 0700  ceFAZolin (ANCEF) 3 g in dextrose 5 % 50 mL IVPB     3 g 130  mL/hr over 30 Minutes Intravenous To [facility] 01/21/16 1351 01/22/16 1223    On postoperative day #1 she was awake alert oriented 4 dressing had come off twice during the evening and no dressing was placed at the time of rounds. She was asleep when I entered the room and then immediately turned over and set up without a great deal of hesitation. Tends to cross her legs when she is sitting. She tolerated the medicines by mouth IV narcotic medicines however seem to cause blood pressure to drop. Received a phone call when the patient apparently fell while being supervised and to go to the bathroom. She was placed at bedrest. She did hit her head and had a hematoma over the occiput. A skull x-ray was obtained and radiographs of her recent lumbar fusion site turned normal. She is noted to be running low blood pressures so that a medicine consult was obtained to assist and manipulating her antihypertensive agents during the immediate postoperative period. Locations were held or low blood pressure. Her potassium noted to be elevated due to potassium retention from her antihypertensive agents and IV fluids were initially held in resume with normal saline alone a fluid bolus and then IV rate 100 mL an hour. With this she had significant improvement in her blood pressure and in her standing and walking tolerance.  She was seen by physical therapy taught how to do on brace which was obtained. Aspen brace she is sitting upright and when she was out of bed. His operative day #2 awake alert and oriented 4 dressing was changed to an OpSite with 4 x 4's as it continued to have difficulties remaining in place. CT scan of the patient's cranium and returned without acute findings. She was taking and tolerating by mouth narcotics and by mouth nourishment. IV morphine had been discontinued. [facility] hospitalist were instrumental in assisting [place] in controlling this patient's large sugar decompression and then assisting [place] and  maintenance of her antihypertensive agents. Seen in physical therapy on postoperative day #1 and postoperative day #2 and underwent training both gait training stair training and functional assessment. Patient's hemoglobin was 0.6 on postoperative day #2 her blood glucose level in the low 200s the operative hemoglobin A1c at 7.8 suggests poor control on a chronic basis. On postoperative day #3 she was awake alert oriented 4. Results of her plasma cortisol indicated cortisol level II.7 just that there may be a little element of hypoadrenalism. This will need to be addressed as an outpatient and determined as to whether or not she would need him form of adrenal coverage. Her lower extremities neurovascularly normal area he was awake and alert and oriented 4. On Friday, 01/25/2016 she was awake alert and oriented 4 taking and tolerating by mouth narcotics and by mouth nourishment. She has done well with physical therapy and transition to a point where she could be discharged she will however require follow-up in regards to the numerous concerns identified during her hospitalization including, antihypertensive agent use and concerns of a serum cortisol level of 2.7 at 11 AM normal should be in the range of 6-10. These will need to be addressed on an outpatient basis.  She was given sequential compression devices and early ambulation for DVT prophylaxis.  She benefited maximally from their hospital stay and there were no complications.    Recent vital signs:  Filed Vitals:   01/24/16 1948 01/25/16 0500  BP: 127/55 114/57  Pulse: 82 74  Temp: 98.4 F (36.9 C) 98.2 F (36.8 C)  Resp: 18 18    Recent laboratory studies:  Results for orders placed or performed during the hospital encounter of 01/22/16  Glucose, capillary  Result Value Ref Range   Glucose-Capillary 121 (H) 65 - 99 mg/dL   Comment 1 Notify RN    Comment 2 Document in Chart   Glucose, capillary  Result Value Ref Range    Glucose-Capillary 278 (H) 65 - 99 mg/dL   Comment 1 Notify RN   Glucose, capillary  Result Value Ref Range   Glucose-Capillary 226 (H) 65 - 99 mg/dL   Comment 1 Notify RN    Comment 2 Document in Chart   Hemoglobin A1c  Result Value Ref Range   Hgb A1c MFr Bld 7.8 (H) 4.8 - 5.6 %   Mean Plasma Glucose 177 mg/dL  CBC  Result Value Ref Range   WBC 9.8 4.0 - 10.5 K/uL   RBC 4.17 3.87 - 5.11 MIL/uL   Hemoglobin 12.2 12.0 - 15.0 g/dL   HCT 16.1 09.6 - 04.5 %   MCV 91.8 78.0 - 100.0 fL   MCH 29.3 26.0 - 34.0 pg   MCHC 31.9 30.0 - 36.0 g/dL   RDW 40.9 81.1 - 91.4 %   Platelets 150 150 - 400 K/uL  Basic Metabolic Panel  Result Value Ref Range  Sodium 133 (L) 135 - 145 mmol/L   Potassium 5.2 (H) 3.5 - 5.1 mmol/L   Chloride 100 (L) 101 - 111 mmol/L   CO2 26 22 - 32 mmol/L   Glucose, Bld 244 (H) 65 - 99 mg/dL   BUN 18 6 - 20 mg/dL   Creatinine, Ser 1.61 (H) 0.44 - 1.00 mg/dL   Calcium 8.1 (L) 8.9 - 10.3 mg/dL   GFR calc non Af Amer 57 (L) >60 mL/min   GFR calc Af Amer >60 >60 mL/min   Anion gap 7 5 - 15  Glucose, capillary  Result Value Ref Range   Glucose-Capillary 335 (H) 65 - 99 mg/dL  Glucose, capillary  Result Value Ref Range   Glucose-Capillary 296 (H) 65 - 99 mg/dL  Glucose, capillary  Result Value Ref Range   Glucose-Capillary 216 (H) 65 - 99 mg/dL  Glucose, capillary  Result Value Ref Range   Glucose-Capillary 200 (H) 65 - 99 mg/dL   Comment 1 Repeat Test    Comment 2 Document in Chart   Glucose, capillary  Result Value Ref Range   Glucose-Capillary 207 (H) 65 - 99 mg/dL   Comment 1 Repeat Test    Comment 2 Document in Chart   CBC with Differential/Platelet  Result Value Ref Range   WBC 6.9 4.0 - 10.5 K/uL   RBC 3.95 3.87 - 5.11 MIL/uL   Hemoglobin 11.6 (L) 12.0 - 15.0 g/dL   HCT 09.6 04.5 - 40.9 %   MCV 93.7 78.0 - 100.0 fL   MCH 29.4 26.0 - 34.0 pg   MCHC 31.4 30.0 - 36.0 g/dL   RDW 81.1 91.4 - 78.2 %   Platelets 125 (L) 150 - 400 K/uL   Neutrophils  Relative % 55 %   Neutro Abs 3.8 1.7 - 7.7 K/uL   Lymphocytes Relative 30 %   Lymphs Abs 2.0 0.7 - 4.0 K/uL   Monocytes Relative 13 %   Monocytes Absolute 0.9 0.1 - 1.0 K/uL   Eosinophils Relative 2 %   Eosinophils Absolute 0.1 0.0 - 0.7 K/uL   Basophils Relative 0 %   Basophils Absolute 0.0 0.0 - 0.1 K/uL  Basic metabolic panel  Result Value Ref Range   Sodium 137 135 - 145 mmol/L   Potassium 4.3 3.5 - 5.1 mmol/L   Chloride 104 101 - 111 mmol/L   CO2 26 22 - 32 mmol/L   Glucose, Bld 216 (H) 65 - 99 mg/dL   BUN 16 6 - 20 mg/dL   Creatinine, Ser 9.56 0.44 - 1.00 mg/dL   Calcium 8.3 (L) 8.9 - 10.3 mg/dL   GFR calc non Af Amer >60 >60 mL/min   GFR calc Af Amer >60 >60 mL/min   Anion gap 7 5 - 15  Glucose, capillary  Result Value Ref Range   Glucose-Capillary 180 (H) 65 - 99 mg/dL  Glucose, capillary  Result Value Ref Range   Glucose-Capillary 169 (H) 65 - 99 mg/dL   Comment 1 Notify RN    Comment 2 Document in Chart   Cortisol  Result Value Ref Range   Cortisol, Plasma 2.7 ug/dL  Glucose, capillary  Result Value Ref Range   Glucose-Capillary 196 (H) 65 - 99 mg/dL   Comment 1 Repeat Test    Comment 2 Document in Chart   Glucose, capillary  Result Value Ref Range   Glucose-Capillary 175 (H) 65 - 99 mg/dL   Comment 1 Repeat Test    Comment 2 Document  in Chart   Glucose, capillary  Result Value Ref Range   Glucose-Capillary 183 (H) 65 - 99 mg/dL  Glucose, capillary  Result Value Ref Range   Glucose-Capillary 193 (H) 65 - 99 mg/dL    Discharge Medications:     Medication List    TAKE these medications        cholecalciferol 1000 units tablet  Commonly known as:  VITAMIN D  Take 1,000 Units by mouth daily.     clotrimazole-betamethasone cream  Commonly known as:  LOTRISONE  Apply 1 application topically daily.     HUMULIN R 500 UNIT/ML injection  Generic drug:  insulin regular human CONCENTRATED  Inject 20 Units into the skin 3 (three) times daily.      hydrochlorothiazide 25 MG tablet  Commonly known as:  HYDRODIURIL  Take 1 tablet by mouth daily as needed.     hydrOXYzine 50 MG tablet  Commonly known as:  ATARAX/VISTARIL  Take 1 tablet (50 mg total) by mouth every 6 (six) hours as needed for itching.     INVOKANA 100 MG Tabs tablet  Generic drug:  canagliflozin  Take 100 mg by mouth daily.     lisinopril 5 MG tablet  Commonly known as:  PRINIVIL,ZESTRIL  Take 5 mg by mouth daily.     methocarbamol 500 MG tablet  Commonly known as:  ROBAXIN  Take 1 tablet (500 mg total) by mouth every 6 (six) hours as needed for muscle spasms.     oxyCODONE-acetaminophen 5-325 MG tablet  Commonly known as:  PERCOCET/ROXICET  Take 1-2 tablets by mouth every 4 (four) hours as needed for moderate pain.     sertraline 50 MG tablet  Commonly known as:  ZOLOFT  Take 1 tablet by mouth at bedtime.        Diagnostic Studies: Dg Skull Complete  01/23/2016  CLINICAL DATA:  Injury.  Fall today, dizziness. EXAM: SKULL - COMPLETE 4 + VIEW COMPARISON:  None. FINDINGS: There is no evidence of skull fracture or other focal bone lesions. IMPRESSION: Negative. Electronically Signed   By: Charlett Nose M.D.   On: 01/23/2016 11:39   Dg Lumbar Spine Complete  01/23/2016  CLINICAL DATA:  Fall, dizziness, status post lumbar surgery yesterday EXAM: LUMBAR SPINE - COMPLETE 4+ VIEW COMPARISON:  Intraoperative radiographs dated 01/22/2016 FINDINGS: Five lumbar type vertebral bodies. Normal lumbar lordosis. No evidence of fracture or dislocation. Vertebral heights are maintained. Status post L4-5 PLIF. No evidence of hardware fracture or loosening. Mild multilevel degenerative changes. Visualized bony pelvis appears intact. IMPRESSION: No fracture or dislocation is seen. Status post L4-5 PLIF, without evidence of complication. Mild degenerative changes. Electronically Signed   By: Charline Bills M.D.   On: 01/23/2016 11:40   Dg Lumbar Spine Complete  01/22/2016  CLINICAL  DATA:  Removal of failed hardware at L4-5, right L4-5 foraminotomy, redo trans foraminal lumbar interbody fusion with cage at L4-5, resection of left synovial cyst at L3-4. EXAM: Operative LUMBAR SPINE - COMPLETE 4+ VIEW COMPARISON:  MRI lumbar spine 08/27/2015. FINDINGS: AP, lateral and bilateral oblique spot images of the lumbar spine are submitted for interpretation postoperatively. L4-5 PLIF with interbody fusion plugs appropriately positioned in the disc space. Anatomic alignment on the lateral image. IMPRESSION: Anatomic alignment post L4-5 PLIF with interbody fusion plugs appropriately positioned in the L4-5 disc space. Electronically Signed   By: Hulan Saas M.D.   On: 01/22/2016 12:46   Ct Head Wo Contrast  01/24/2016  CLINICAL DATA:  Syncopal episode and fall yesterday while using the restroom. The patient has a palpable nodule on her head. EXAM: CT HEAD WITHOUT CONTRAST TECHNIQUE: Contiguous axial images were obtained from the base of the skull through the vertex without intravenous contrast. COMPARISON:  None. FINDINGS: Soft tissue scalp swelling is present over the occipital scalp to the right of midline extending to the vertex. There is no underlying fracture. No acute infarct, hemorrhage, or mass lesion is present. The ventricles are of normal size. No significant extra-axial fluid collection is present. The paranasal sinuses and mastoid air cells are clear. The calvarium is otherwise intact. Atherosclerotic calcifications are present in the cavernous internal carotid arteries. No other significant extracranial soft tissue injury is present. The globes and orbits are intact. IMPRESSION: 1. Soft tissue scalp swelling over the right paramedian occipital scalp without an underlying fracture. 2. Normal CT appearance of the brain. 3. Minimal atherosclerotic calcifications. Electronically Signed   By: Marin Roberts M.D.   On: 01/24/2016 12:56   Dg C-arm 1-60 Min  01/22/2016  CLINICAL DATA:   Removal of failed hardware at L4-5, right L4-5 foraminotomy, redo trans foraminal lumbar interbody fusion with cage at L4-5, resection of left synovial cyst at L3-4. EXAM: Operative LUMBAR SPINE - COMPLETE 4+ VIEW COMPARISON:  MRI lumbar spine 08/27/2015. FINDINGS: AP, lateral and bilateral oblique spot images of the lumbar spine are submitted for interpretation postoperatively. L4-5 PLIF with interbody fusion plugs appropriately positioned in the disc space. Anatomic alignment on the lateral image. IMPRESSION: Anatomic alignment post L4-5 PLIF with interbody fusion plugs appropriately positioned in the L4-5 disc space. Electronically Signed   By: Hulan Saas M.D.   On: 01/22/2016 12:46    Disposition: 01-Home or Self Care      Discharge Instructions    Call MD / Call 911    Complete by:  As directed   If you experience chest pain or shortness of breath, CALL 911 and be transported to the hospital emergency room.  If you develope a fever above 101 F, pus (white drainage) or increased drainage or redness at the wound, or calf pain, call your surgeon's office.     Constipation Prevention    Complete by:  As directed   Drink plenty of fluids.  Prune juice may be helpful.  You may use a stool softener, such as Colace (over the counter) 100 mg twice a day.  Use MiraLax (over the counter) for constipation as needed.     Diet - low sodium heart healthy    Complete by:  As directed      Discharge instructions    Complete by:  As directed   Call if there is increasing drainage, fever greater than 101.5, severe head aches, and worsening nausea or light sensitivity. If shortness of breath, bloody cough or chest tightness or pain go to an emergency room. No lifting greater than 10 lbs. Avoid bending, stooping and twisting. Use brace when sitting and out of bed even to go to bathroom. Walk in house for first 2 weeks then may start to get out slowly increasing distances up to one quarter mile by 4-6  weeks post op. After 5 days may shower and change dressing following bathing with shower.When bathing remove the brace shower and replace brace before getting out of the shower. If drainage, keep dry dressing and do not bathe the incision, use an moisture impervious dressing. Please call and return for scheduled follow up appointment 2 weeks from the time  of surgery. Do not take high blood pressure medications if your systolic blood pressure is less than 115 or diastolic blood pressure is less than 70. May resume the use of Invokana on Saturday 01/26/2016     Driving restrictions    Complete by:  As directed   No driving for 6 weeks     Increase activity slowly as tolerated    Complete by:  As directed      Lifting restrictions    Complete by:  As directed   No lifting for 12 weeks           Follow-up Information    Follow up with NITKA,JAMES E, MD In 3 weeks.   Specialty:  Orthopedic Surgery   Why:  For wound re-check   Contact information:   [address] [place] [address] [phone]       Follow up with Deforest Hoyles, MD In 2 weeks.   Specialty:  Family Medicine   Why:  Hypertension/Hypotension, anemia,diabetes.Post op lumbar surgery.       Signed: NITKA,JAMES E 01/25/2016, 8:47 AM

## 2016-01-25 NOTE — Progress Notes (Signed)
     Subjective: 3 Days Post-Op Procedure(s) (LRB): Removal of loosened hardware L4-5, right L4-5 foraminotomy, Redo transforaminal lumbar interbody fusion Left L4-5 with cage, left L3-4 resection of synovial cyst, Reinsertion of rods and screws, local and allograft bone graft (N/A)Awake,alert and oriented x 4. No further falls. Positive flatus. Voiding without difficulty.  Patient reports pain as moderate.    Objective:   VITALS:  Temp:  [98.2 F (36.8 C)-98.6 F (37 C)] 98.2 F (36.8 C) (03/03 0500) Pulse Rate:  [74-82] 74 (03/03 0500) Resp:  [16-18] 18 (03/03 0500) BP: (101-127)/(55-68) 114/57 mmHg (03/03 0500) SpO2:  [96 %-100 %] 96 % (03/03 0500)  Neurologically intact ABD soft Neurovascular intact Sensation intact distally Intact pulses distally Dorsiflexion/Plantar flexion intact Incision: no drainage   LABS  Recent Labs  01/23/16 0452 01/24/16 0400  HGB 12.2 11.6*  WBC 9.8 6.9  PLT 150 125*    Recent Labs  01/23/16 0452 01/24/16 0400  NA 133* 137  K 5.2* 4.3  CL 100* 104  CO2 26 26  BUN 18 16  CREATININE 1.05* 0.80  GLUCOSE 244* 216*   No results for input(s): LABPT, INR in the last 72 hours.   Assessment/Plan: 3 Days Post-Op Procedure(s) (LRB): Removal of loosened hardware L4-5, right L4-5 foraminotomy, Redo transforaminal lumbar interbody fusion Left L4-5 with cage, left L3-4 resection of synovial cyst, Reinsertion of rods and screws, local and allograft bone graft (N/A)  Hypotension, combination of anemia and antihypertensive agents.  Advance diet Up with therapy D/C IV fluids Discharge home with home health  Tiffany Hopkins E 01/25/2016, 8:33 AM

## 2016-01-25 NOTE — Progress Notes (Signed)
[facility] PROGRESS NOTE  Tiffany Hopkins:[id number] DOB: 02-Jul-1956 DOA: 01/22/2016 PCP: Deforest Hoyles, MD  Assessment/Plan: Type II Diabetes, insulin dependent Last A1C 7.8 on 01/22/16 Blood sugar level noted to be 244 - Held home oral diabetic medications.  - SSI  - On Heart healthy carb modified diet. - recommend resume home DM medications at time of DC  Hypotension BP 99/49 mmHg  Pulse 67  Temp(Src) 98.4 F (36.9 C) (Oral)  Resp 16  SpO2 94%  - Received IV fluid bolus - Continuing to hold home BP meds - Chart reviewed. Pt historically had sbp in the low 100's from at least 2013. - BP has remained stable in the 110's overnight. Recommend continuing to hold HCTZ and lisinopril on discharge  Hyperkalemia likely due to diet, lisinopril. Current K 5.2.  - Recommend insulin, diabetic diet and hold lisinopril - Resolved  Recent fall - Cranial xray neg for fracture - have ordered CT head w/o contrast for completeness, findings of soft tissue scalp swelling, otherwise no acute hemorrhage noted  ? Adrenal insufficiency - Cortisol noted to be low post-operatively at 2.7 - Pt denied taking PO steroids prior to admission - Currently asymptomatic - Head CT without obvious lesions - Recommend repeat cortisol level with PCP within one week post-discharge   Antibiotics: Anti-infectives    Start     Dose/Rate Route Frequency Ordered Stop   01/22/16 2000  ceFAZolin (ANCEF) IVPB 1 g/50 mL premix     1 g 100 mL/hr over 30 Minutes Intravenous Every 8 hours 01/22/16 1431 01/23/16 0406   01/22/16 0700  ceFAZolin (ANCEF) 3 g in dextrose 5 % 50 mL IVPB     3 g 130 mL/hr over 30 Minutes Intravenous To [facility] 01/21/16 1351 01/22/16 1223      HPI/Subjective: Eager to go home  Objective: Filed Vitals:   01/24/16 0535 01/24/16 1337 01/24/16 1948 01/25/16 0500  BP: 90/56 101/68 127/55 114/57  Pulse: 75 78 82 74  Temp: 98.5 F (36.9 C) 98.6 F (37 C) 98.4  F (36.9 C) 98.2 F (36.8 C)  TempSrc: Oral Oral Oral Oral  Resp: SpO2: 94% 99% 100% 96%    Intake/Output Summary (Last 24 hours) at 01/25/16 1052 Last data filed at 01/24/16 1810  Gross per 24 hour  Intake   2040 ml  Output      0 ml  Net   2040 ml   There were no vitals filed for this visit.  Exam:   General:  Awake, sitting in bed  Cardiovascular: regular, s1, s2  Respiratory: normal resp effort, no wheezing  Abdomen: soft,nondistended  Musculoskeletal: perfused, no clubbing, no cyanosis  Data Reviewed: Basic Metabolic Panel:  Recent Labs Lab 01/23/16 0452 01/24/16 0400  NA 133* 137  K 5.2* 4.3  CL 100* 104  CO2 26 26  GLUCOSE 244* 216*  BUN 18 16  CREATININE 1.05* 0.80  CALCIUM 8.1* 8.3*   Liver Function Tests: No results for input(s): AST, ALT, ALKPHOS, BILITOT, PROT, ALBUMIN in the last 168 hours. No results for input(s): LIPASE, AMYLASE in the last 168 hours. No results for input(s): AMMONIA in the last 168 hours. CBC:  Recent Labs Lab 01/23/16 0452 01/24/16 0400  WBC 9.8 6.9  NEUTROABS  --  3.8  HGB 12.2 11.6*  HCT 38.3 37.0  MCV 91.8 93.7  PLT 150 125*   Cardiac Enzymes: No results for input(s): CKTOTAL, CKMB, CKMBINDEX, TROPONINI in the last 168  hours. BNP (last 3 results) No results for input(s): BNP in the last 8760 hours.  ProBNP (last 3 results) No results for input(s): PROBNP in the last 8760 hours.  CBG:  Recent Labs Lab 01/24/16 0635 01/24/16 1119 01/24/16 1600 01/24/16 2114 01/25/16 0620  GLUCAP 169* 196* 175* 183* 193*    Recent Results (from the past 240 hour(s))  Surgical pcr screen     Status: None   Collection Time: 01/17/16  2:27 PM  Result Value Ref Range Status   MRSA, PCR NEGATIVE NEGATIVE Final   Staphylococcus aureus NEGATIVE NEGATIVE Final    Comment:        The Xpert SA Assay (FDA approved for NASAL specimens in patients over 60 years of age), is one component of a comprehensive  surveillance program.  Test performance has been validated by [facility] for patients greater than or equal to 60 year old. It is not intended to diagnose infection nor to guide or monitor treatment.      Studies: Dg Skull Complete  01/23/2016  CLINICAL DATA:  Injury.  Fall today, dizziness. EXAM: SKULL - COMPLETE 4 + VIEW COMPARISON:  None. FINDINGS: There is no evidence of skull fracture or other focal bone lesions. IMPRESSION: Negative. Electronically Signed   By: Charlett NoseKevin  Dover M.D.   On: 01/23/2016 11:39   Dg Lumbar Spine Complete  01/23/2016  CLINICAL DATA:  Fall, dizziness, status post lumbar surgery yesterday EXAM: LUMBAR SPINE - COMPLETE 4+ VIEW COMPARISON:  Intraoperative radiographs dated 01/22/2016 FINDINGS: Five lumbar type vertebral bodies. Normal lumbar lordosis. No evidence of fracture or dislocation. Vertebral heights are maintained. Status post L4-5 PLIF. No evidence of hardware fracture or loosening. Mild multilevel degenerative changes. Visualized bony pelvis appears intact. IMPRESSION: No fracture or dislocation is seen. Status post L4-5 PLIF, without evidence of complication. Mild degenerative changes. Electronically Signed   By: Charline BillsSriyesh  Krishnan M.D.   On: 01/23/2016 11:40   Ct Head Wo Contrast  01/24/2016  CLINICAL DATA:  Syncopal episode and fall yesterday while using the restroom. The patient has a palpable nodule on her head. EXAM: CT HEAD WITHOUT CONTRAST TECHNIQUE: Contiguous axial images were obtained from the base of the skull through the vertex without intravenous contrast. COMPARISON:  None. FINDINGS: Soft tissue scalp swelling is present over the occipital scalp to the right of midline extending to the vertex. There is no underlying fracture. No acute infarct, hemorrhage, or mass lesion is present. The ventricles are of normal size. No significant extra-axial fluid collection is present. The paranasal sinuses and mastoid air cells are clear. The calvarium is otherwise  intact. Atherosclerotic calcifications are present in the cavernous internal carotid arteries. No other significant extracranial soft tissue injury is present. The globes and orbits are intact. IMPRESSION: 1. Soft tissue scalp swelling over the right paramedian occipital scalp without an underlying fracture. 2. Normal CT appearance of the brain. 3. Minimal atherosclerotic calcifications. Electronically Signed   By: Marin Robertshristopher  Mattern M.D.   On: 01/24/2016 12:56    Scheduled Meds: . clotrimazole   Topical BID  . docusate sodium  100 mg Oral BID  . insulin aspart  0-20 Units Subcutaneous TID WC  . insulin aspart  4 Units Subcutaneous TID WC  . pantoprazole  40 mg Oral Daily  . sertraline  50 mg Oral QHS  . sodium chloride flush  3 mL Intravenous Q12H   Continuous Infusions: . sodium chloride    . sodium chloride 100 mL/hr at 01/25/16 0439  Principal Problem:   Pseudarthrosis following spinal fusion Active Problems:   Spinal stenosis, lumbar region, with neurogenic claudication   Painful orthopaedic hardware   Idiopathic hypotension   Diabetes mellitus with complication (HCC)   Hyperkalemia  Rivan Siordia K  [facility] Pager [phone]. If 7PM-7AM, please contact night-coverage at [email or web address], password [facility] 01/25/2016, 10:52 AM  LOS: 3 days

## 2016-01-25 NOTE — Progress Notes (Signed)
Physical Therapy Treatment Patient Details Name: Tiffany Hopkins MRN: [id number] DOB: 24-Jun-1956 Today's Date: 01/25/2016    History of Present Illness Admitted for Removal of loosened hardware L4-5, right L4-5 foraminotomy, Redo transforaminal lumbar interbody fusion Left L4-5 with cage, left L3-4 resection of synovial cyst, Reinsertion of rods and screws, local and allograft bone graft;  has a past medical history of Anxiety; Depression; COPD (chronic obstructive pulmonary disease) (HCC); Pneumonia; Diabetes mellitus; Seizures (HCC); Arthritis; Hepatitis; PONV (postoperative nausea and vomiting); Heart murmur; Hypertension; Blood clot of vein in shoulder area; Sleep apnea; and OSA on CPAP.    PT Comments    Patient able to negotiate stairs safely this session and ambulated further distance.  Did review precautions and car transfer technique with patient and spouse.  Patient will benefit from follow up HHPT at d/c.  Follow Up Recommendations  Home health PT;Supervision for mobility/OOB     Equipment Recommendations  3in1 (PT)    Recommendations for Other Services       Precautions / Restrictions Precautions Precautions: Back;Fall Precaution Booklet Issued: Yes (comment) Precaution Comments: Reviewed back precautions Required Braces or Orthoses: Spinal Brace Spinal Brace: Lumbar corset;Applied in sitting position    Mobility  Bed Mobility               General bed mobility comments: up in chair; reviewed technique via demonstration  Transfers   Equipment used: Rolling walker (2 wheeled) Transfers: Sit to/from Stand Sit to Stand: Min guard         General transfer comment: Patient using correct hand placement and technique.  Ambulation/Gait Ambulation/Gait assistance: Supervision;Min guard (spouse following with chair) Ambulation Distance (Feet): 120 Feet (x 2) Assistive device: Rolling walker (2 wheeled) Gait Pattern/deviations: Step-through pattern;Decreased  stride length;Antalgic     General Gait Details: slightly antalgic on R   Stairs Stairs: Yes Stairs assistance: Min guard Stair Management: Step to pattern;Sideways;One rail Left Number of Stairs: 2 General stair comments: spouse present and educated on how to assist with moving walker and guarding on downside of patient for safety  Wheelchair Mobility    Modified Rankin (Stroke Patients Only)       Balance     Sitting balance-Leahy Scale: Good       Standing balance-Leahy Scale: Fair                      Cognition Arousal/Alertness: Awake/alert Behavior During Therapy: WFL for tasks assessed/performed Overall Cognitive Status: Within Functional Limits for tasks assessed                      Exercises      General Comments        Pertinent Vitals/Pain Pain Score: 7  Pain Location: incision site; higher with sit to stand Pain Descriptors / Indicators: Sore;Sharp Pain Intervention(s): Repositioned;RN gave pain meds during session;Ice applied;Monitored during session    Home Living                      Prior Function            PT Goals (current goals can now be found in the care plan section) Progress towards PT goals: Progressing toward goals    Frequency  Min 5X/week    PT Plan Current plan remains appropriate    Co-evaluation             End of Session Equipment Utilized During Treatment: Gait belt;Back brace  Activity Tolerance: Patient tolerated treatment well Patient left: in chair;with call bell/phone within reach;with family/visitor present     Time: 0927-0958 PT Time Calculation (min) (ACUTE ONLY): 31 min  Charges:  $Gait Training: 23-37 mins                    G Codes:      Elray McgregorCynthia Wynn 01/25/2016, 4:44 PM Sheran Lawlessyndi Wynn, PT [phone] 01/25/2016

## 2016-09-08 ENCOUNTER — Ambulatory Visit (INDEPENDENT_AMBULATORY_CARE_PROVIDER_SITE_OTHER): Payer: Self-pay | Admitting: Specialist

## 2017-06-13 IMAGING — DX DG SKULL COMPLETE 4+V
5 series · 5 of 5 positions shown · non-contrast
Comparison: None.

CLINICAL DATA: Injury.  Fall today, dizziness.

EXAM:
SKULL - COMPLETE 4 + VIEW

[t skull lat (1 of 2)]
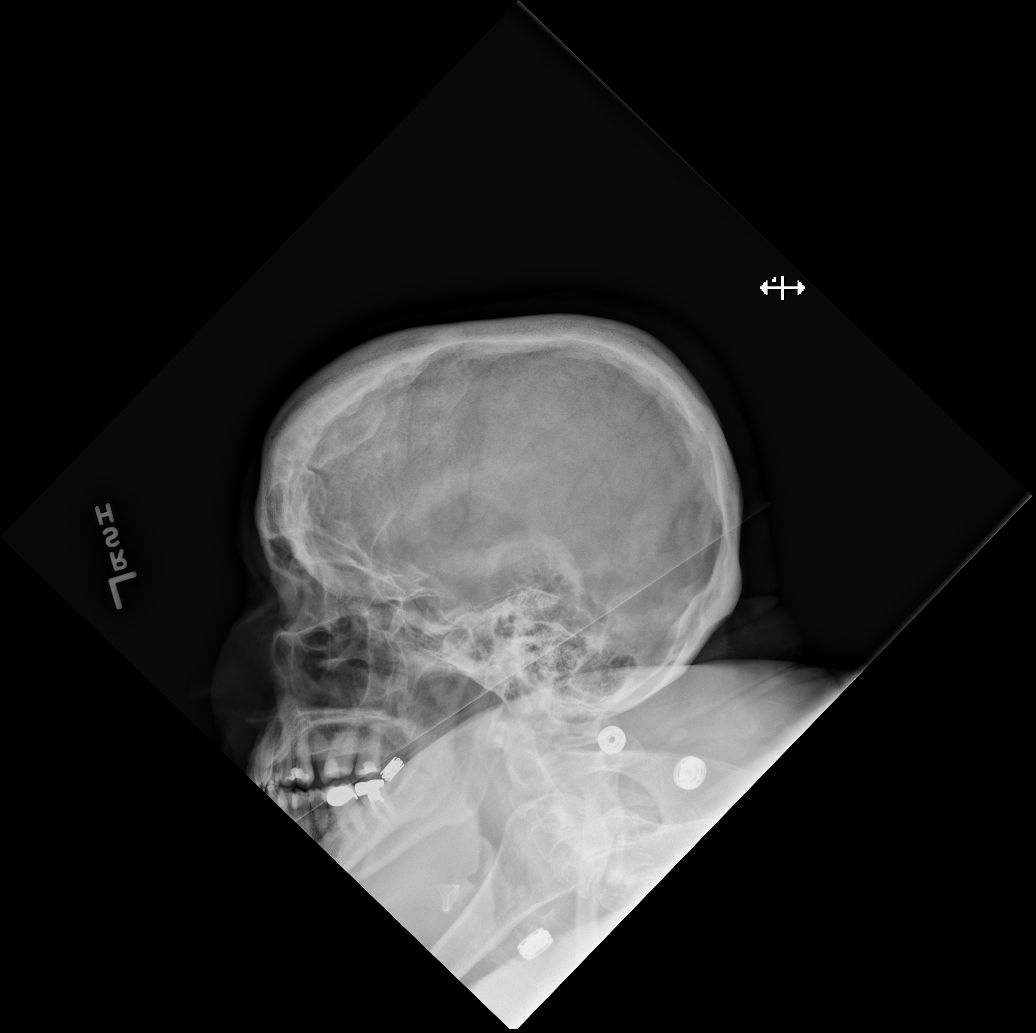

[t skull ap]
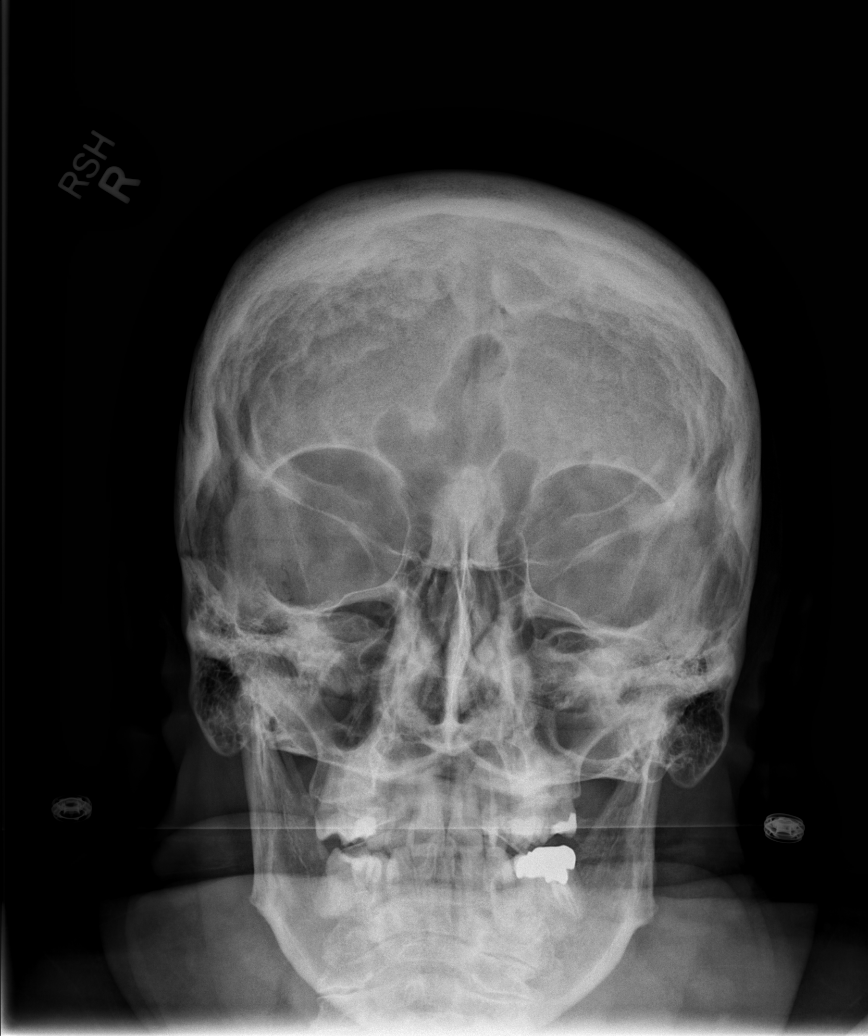

[[person_name] ap (1 of 2)]
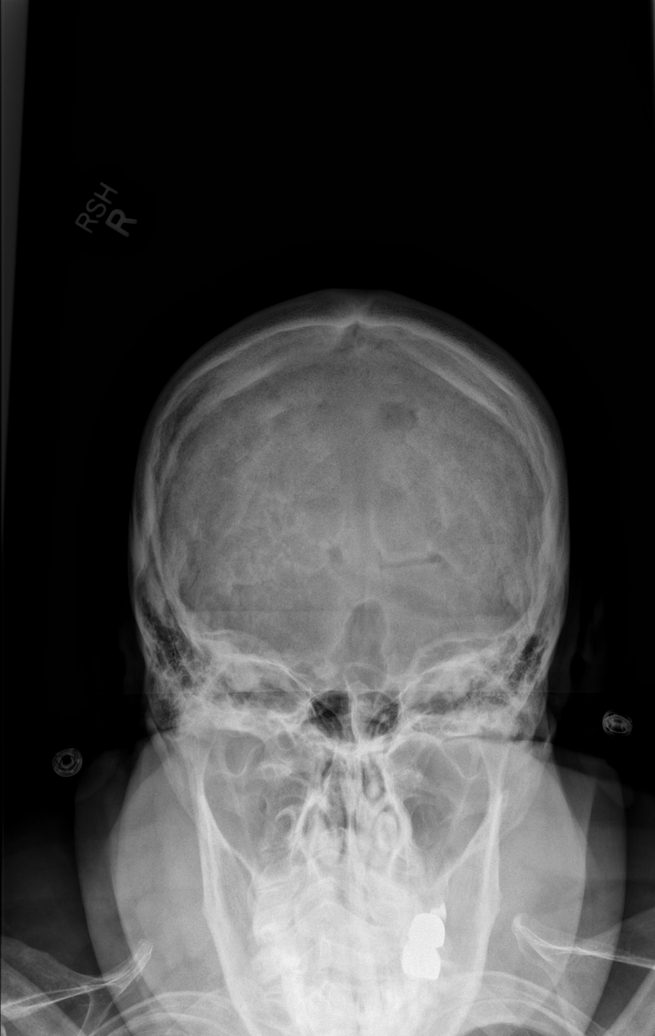

[[person_name] ap (2 of 2)]
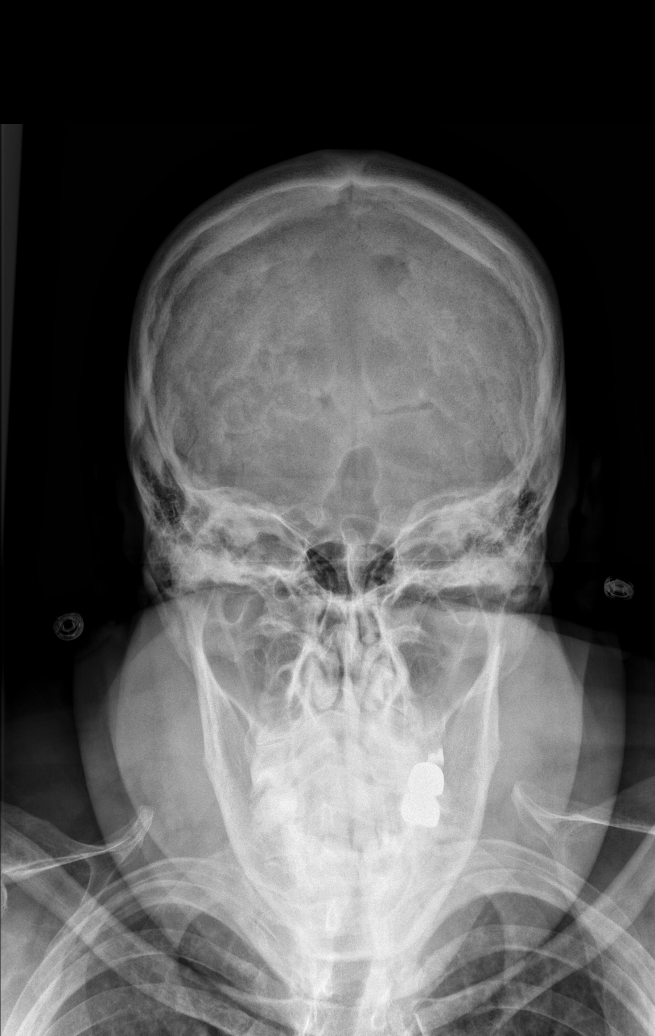

[t skull lat (2 of 2)]
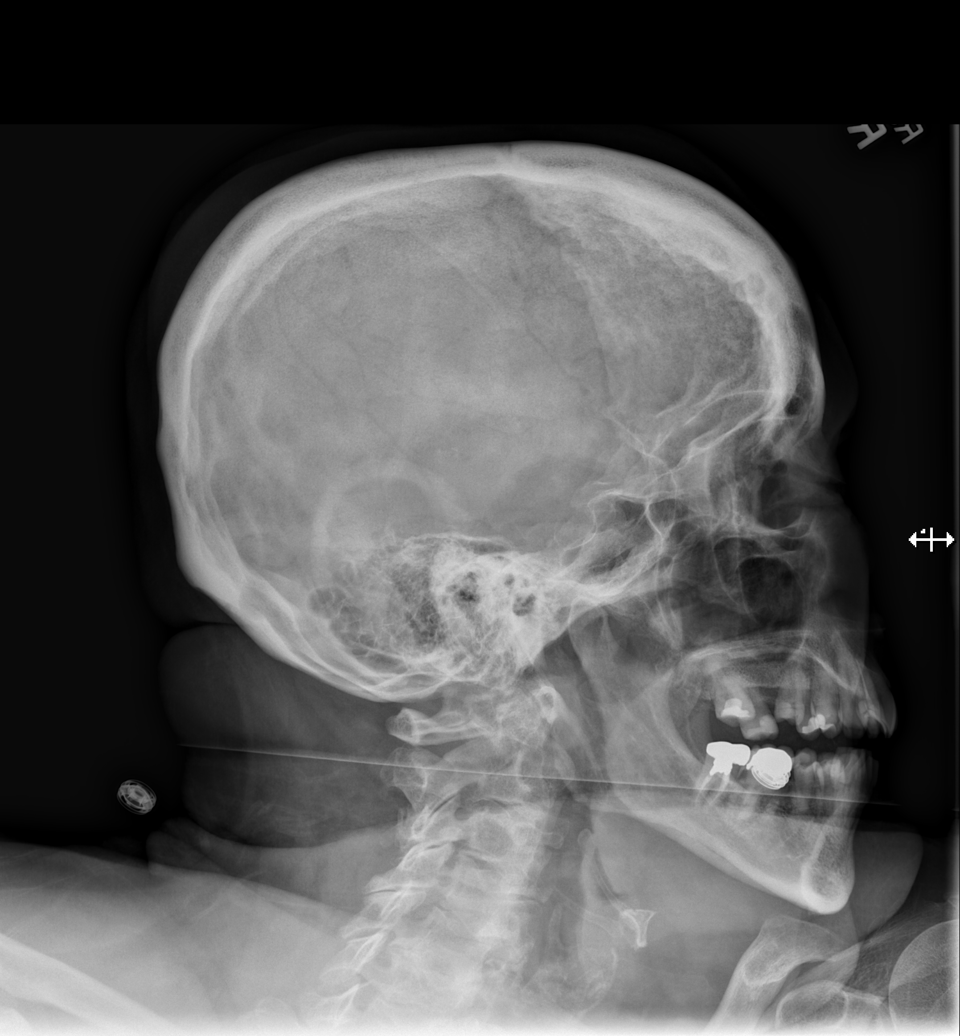

[5 of 5 positions shown; findings below may reference images not displayed]

FINDINGS: There is no evidence of skull fracture or other focal bone lesions.
IMPRESSION: Negative.

## 2017-06-14 IMAGING — CT CT HEAD W/O CM
1 series · 15 of 30 positions shown, 19 images · non-contrast
Comparison: None.

CLINICAL DATA: Syncopal episode and fall yesterday while using the
restroom. The patient has a palpable nodule on her head.

EXAM:
CT HEAD WITHOUT CONTRAST
TECHNIQUE: Contiguous axial images were obtained from the base of the skull
through the vertex without intravenous contrast.

[Series 2: head 5.0 h30s · axial · 0.45mm/px · z∈[-143,-8]mm · 15 of 30 slices shown, 19 images]
[im 2/30  brain]
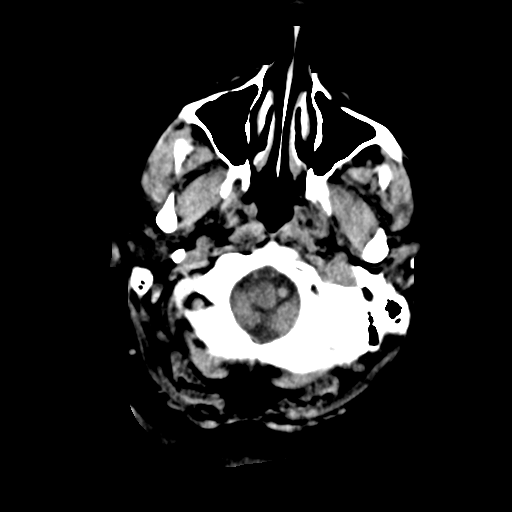
[im 2/30  bone]
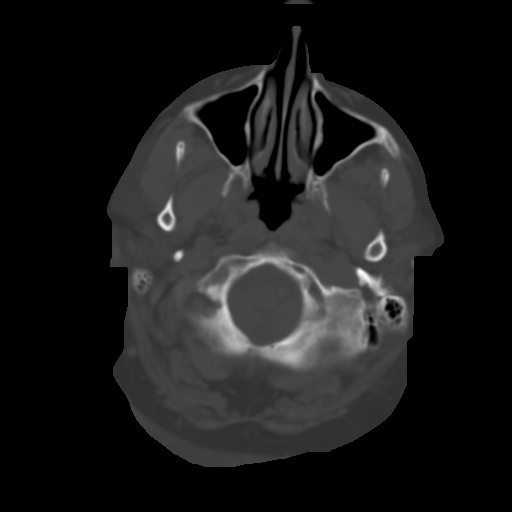
[im 4/30  brain]
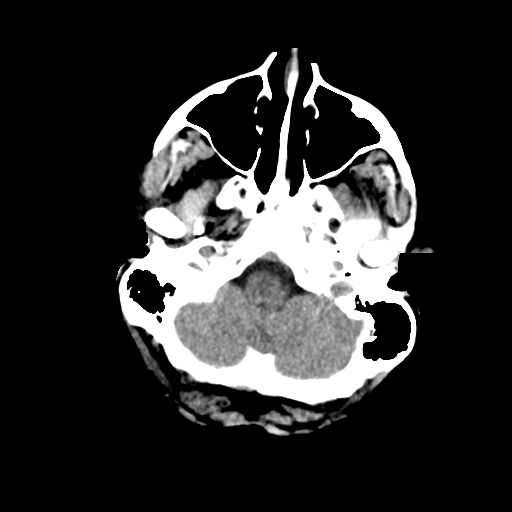
[im 6/30  brain]
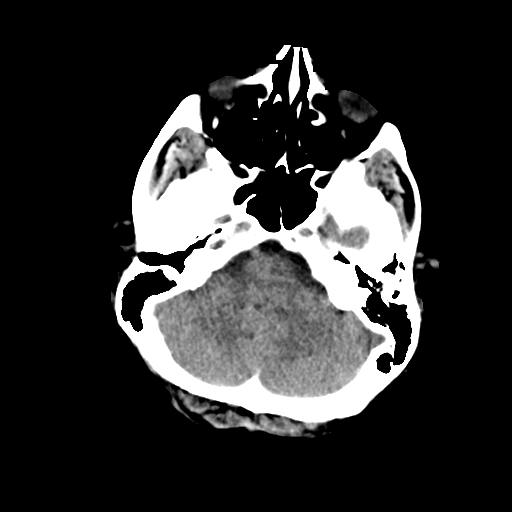
[im 8/30  brain]
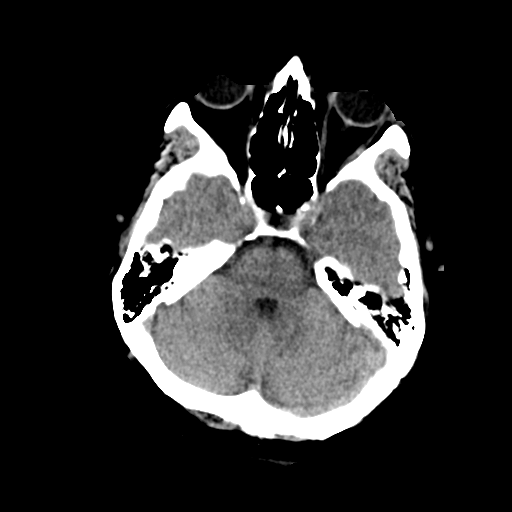
[im 10/30  brain]
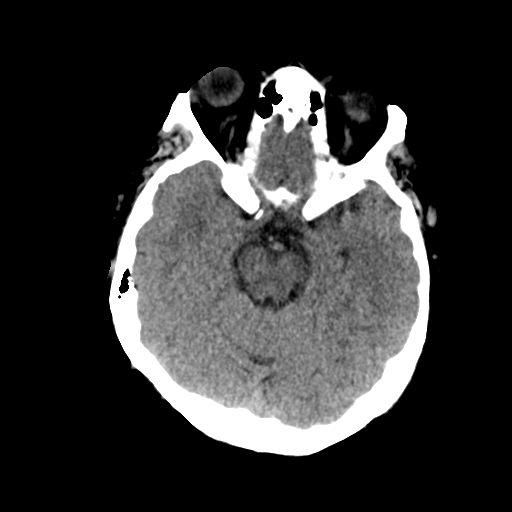
[im 10/30  bone]
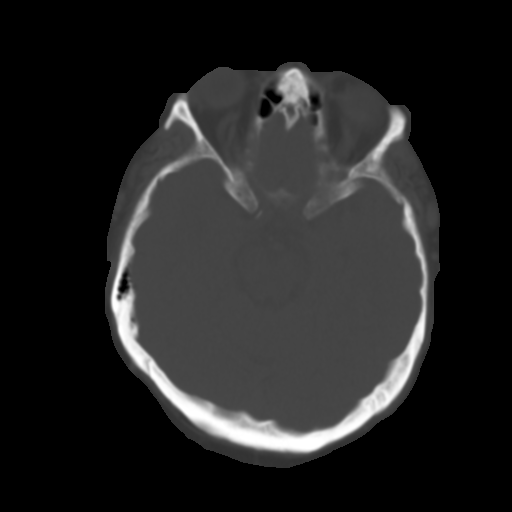
[im 12/30  brain]
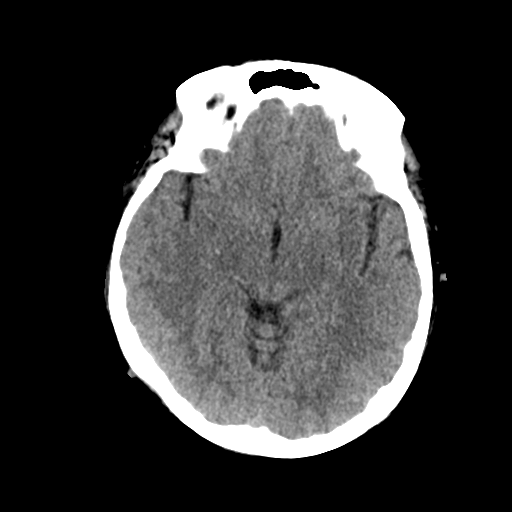
[im 14/30  brain]
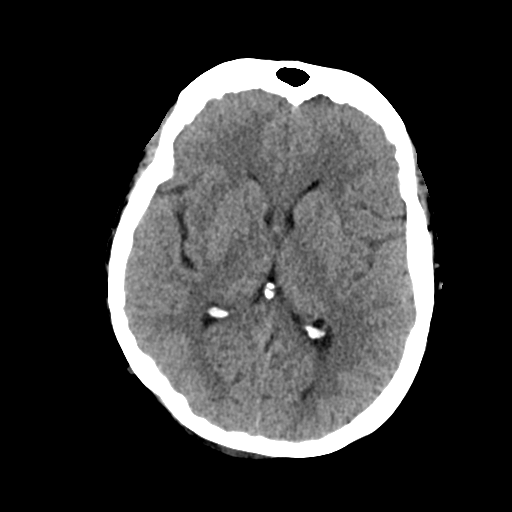
[im 16/30  brain]
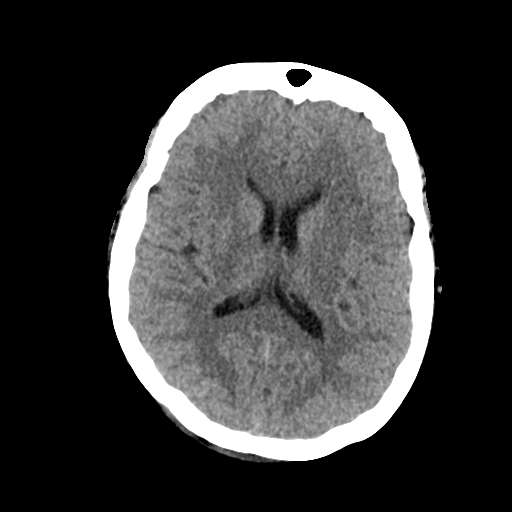
[im 17/30  brain]
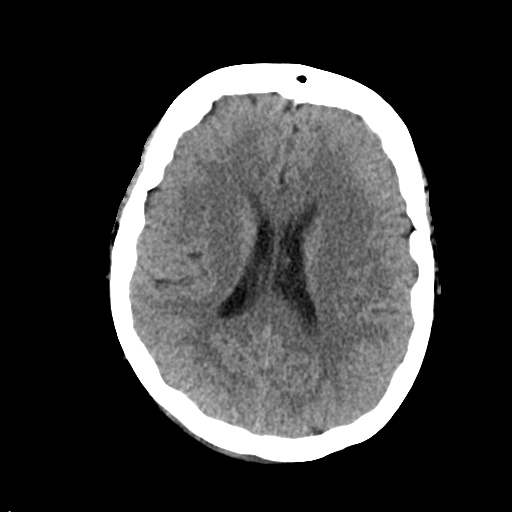
[im 17/30  bone]
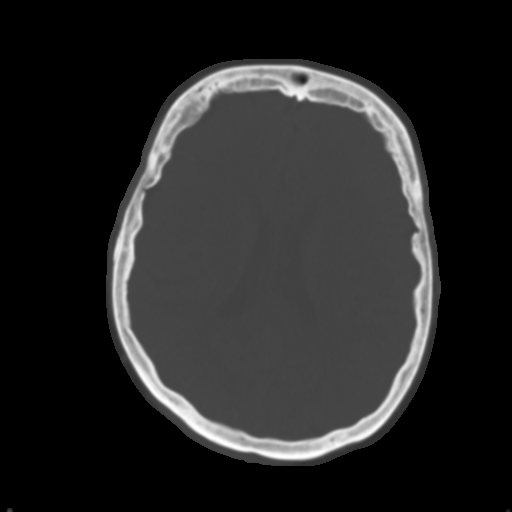
[im 19/30  brain]
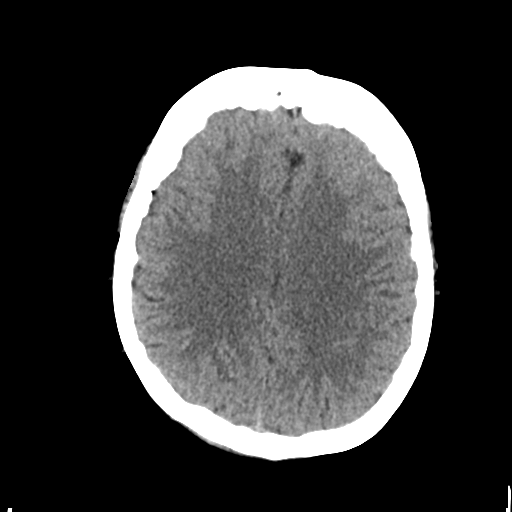
[im 21/30  brain]
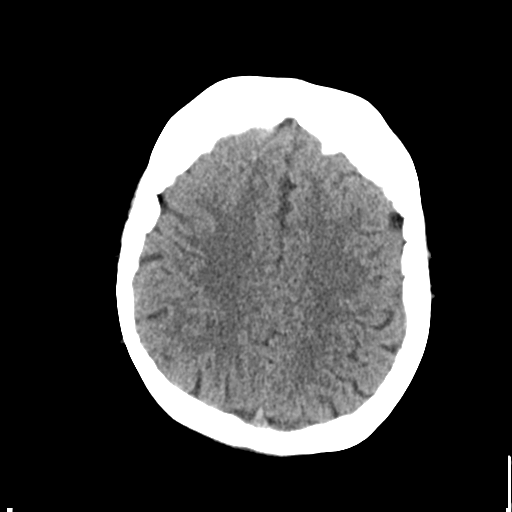
[im 23/30  brain]
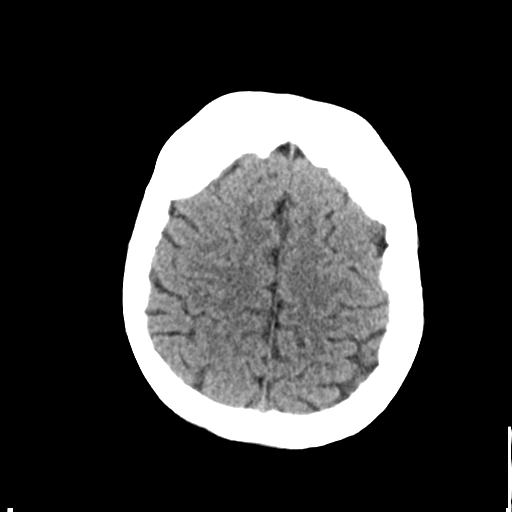
[im 25/30  brain]
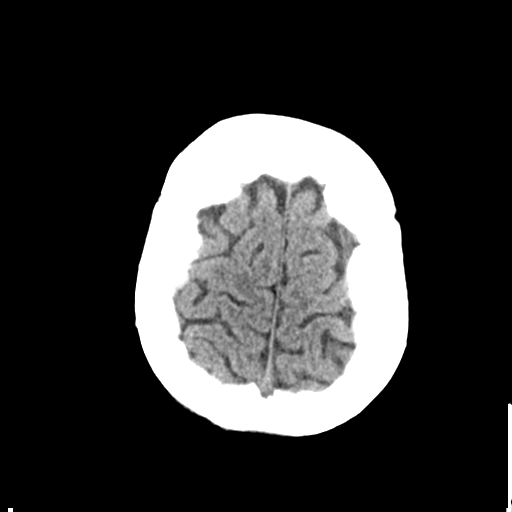
[im 25/30  bone]
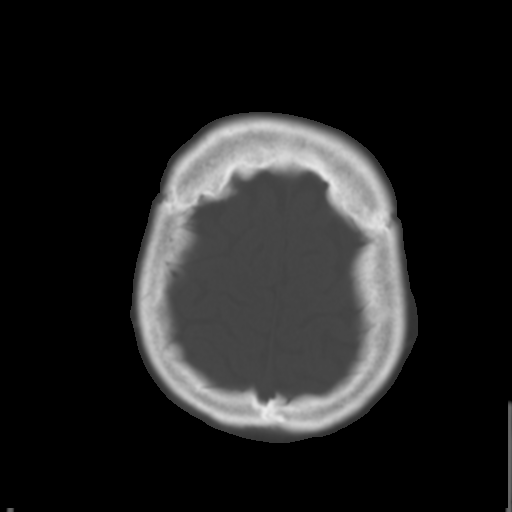
[im 27/30  brain]
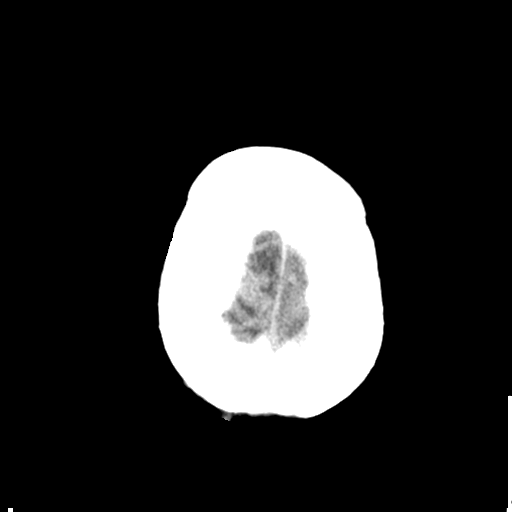
[im 29/30  brain]
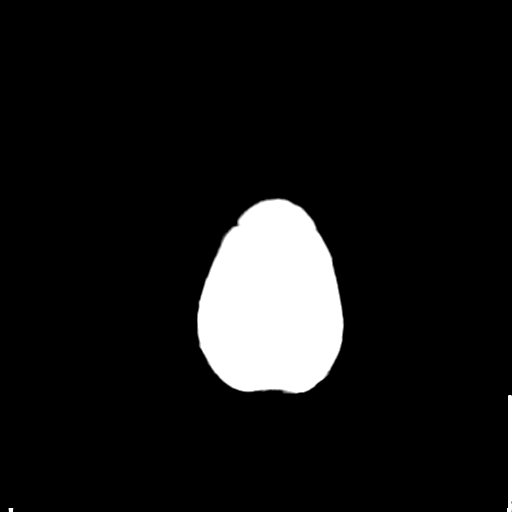

[15 of 30 positions shown; findings below may reference images not displayed]

FINDINGS: Soft tissue scalp swelling is present over the occipital scalp to
the right of midline extending to the vertex. There is no underlying
fracture.

No acute infarct, hemorrhage, or mass lesion is present. The
ventricles are of normal size. No significant extra-axial fluid
collection is present.

The paranasal sinuses and mastoid air cells are clear. The calvarium
is otherwise intact. Atherosclerotic calcifications are present in
the cavernous internal carotid arteries. No other significant
extracranial soft tissue injury is present. The globes and orbits
are intact.
IMPRESSION: 1. Soft tissue scalp swelling over the right paramedian occipital
scalp without an underlying fracture.
2. Normal CT appearance of the brain.
3. Minimal atherosclerotic calcifications.
